# Patient Record
Sex: Female | Born: 1960 | Race: Black or African American | Hispanic: No | Smoking: Never smoker
Health system: Southern US, Community
[De-identification: ages and names within clinical notes are randomized; demographics above are authoritative.]

## PROBLEM LIST (undated history)

## (undated) DIAGNOSIS — F329 Major depressive disorder, single episode, unspecified: Secondary | ICD-10-CM

## (undated) DIAGNOSIS — E079 Disorder of thyroid, unspecified: Secondary | ICD-10-CM

## (undated) DIAGNOSIS — E785 Hyperlipidemia, unspecified: Secondary | ICD-10-CM

## (undated) DIAGNOSIS — R519 Headache, unspecified: Secondary | ICD-10-CM

## (undated) DIAGNOSIS — I10 Essential (primary) hypertension: Secondary | ICD-10-CM

## (undated) DIAGNOSIS — E119 Type 2 diabetes mellitus without complications: Secondary | ICD-10-CM

## (undated) DIAGNOSIS — G40909 Epilepsy, unspecified, not intractable, without status epilepticus: Secondary | ICD-10-CM

## (undated) DIAGNOSIS — F32A Depression, unspecified: Secondary | ICD-10-CM

## (undated) DIAGNOSIS — R51 Headache: Secondary | ICD-10-CM

## (undated) DIAGNOSIS — D649 Anemia, unspecified: Secondary | ICD-10-CM

## (undated) DIAGNOSIS — K760 Fatty (change of) liver, not elsewhere classified: Secondary | ICD-10-CM

## (undated) HISTORY — DX: Epilepsy, unspecified, not intractable, without status epilepticus: G40.909

## (undated) HISTORY — DX: Headache: R51

## (undated) HISTORY — DX: Headache, unspecified: R51.9

## (undated) HISTORY — DX: Major depressive disorder, single episode, unspecified: F32.9

## (undated) HISTORY — DX: Hyperlipidemia, unspecified: E78.5

## (undated) HISTORY — PX: TUBAL LIGATION: SHX77

## (undated) HISTORY — DX: Depression, unspecified: F32.A

## (undated) HISTORY — DX: Anemia, unspecified: D64.9

## (undated) HISTORY — PX: TONSILLECTOMY: SUR1361

## (undated) HISTORY — PX: VEIN LIGATION AND STRIPPING: SHX2653

## (undated) HISTORY — DX: Disorder of thyroid, unspecified: E07.9

## (undated) HISTORY — DX: Type 2 diabetes mellitus without complications: E11.9

---

## 2004-01-10 ENCOUNTER — Emergency Department: Payer: Self-pay | Admitting: Emergency Medicine

## 2004-01-11 ENCOUNTER — Emergency Department: Payer: Self-pay | Admitting: Emergency Medicine

## 2004-01-12 ENCOUNTER — Emergency Department: Payer: Self-pay | Admitting: Emergency Medicine

## 2004-01-13 ENCOUNTER — Emergency Department: Payer: Self-pay | Admitting: Emergency Medicine

## 2004-01-14 ENCOUNTER — Emergency Department: Payer: Self-pay | Admitting: Emergency Medicine

## 2004-01-17 ENCOUNTER — Emergency Department: Payer: Self-pay | Admitting: Emergency Medicine

## 2005-01-09 ENCOUNTER — Other Ambulatory Visit: Payer: Self-pay

## 2005-01-09 ENCOUNTER — Emergency Department: Payer: Self-pay | Admitting: Unknown Physician Specialty

## 2005-06-29 ENCOUNTER — Emergency Department: Payer: Self-pay | Admitting: Emergency Medicine

## 2005-06-29 ENCOUNTER — Other Ambulatory Visit: Payer: Self-pay

## 2006-08-10 ENCOUNTER — Emergency Department: Payer: Self-pay | Admitting: Emergency Medicine

## 2006-09-01 ENCOUNTER — Ambulatory Visit: Payer: Self-pay | Admitting: Emergency Medicine

## 2007-06-02 ENCOUNTER — Other Ambulatory Visit: Payer: Self-pay

## 2007-06-02 ENCOUNTER — Emergency Department: Payer: Self-pay | Admitting: Emergency Medicine

## 2008-01-24 ENCOUNTER — Emergency Department (HOSPITAL_COMMUNITY): Admission: EM | Admit: 2008-01-24 | Discharge: 2008-01-24 | Payer: Self-pay | Admitting: Emergency Medicine

## 2008-12-06 ENCOUNTER — Emergency Department (HOSPITAL_COMMUNITY): Admission: EM | Admit: 2008-12-06 | Discharge: 2008-12-07 | Payer: Self-pay | Admitting: Emergency Medicine

## 2009-03-27 ENCOUNTER — Emergency Department (HOSPITAL_BASED_OUTPATIENT_CLINIC_OR_DEPARTMENT_OTHER): Admission: EM | Admit: 2009-03-27 | Discharge: 2009-03-27 | Payer: Self-pay | Admitting: Emergency Medicine

## 2009-04-19 ENCOUNTER — Ambulatory Visit (HOSPITAL_COMMUNITY): Admission: RE | Admit: 2009-04-19 | Discharge: 2009-04-19 | Payer: Self-pay | Admitting: Otolaryngology

## 2009-04-19 ENCOUNTER — Encounter (INDEPENDENT_AMBULATORY_CARE_PROVIDER_SITE_OTHER): Payer: Self-pay | Admitting: Otolaryngology

## 2009-09-08 ENCOUNTER — Emergency Department (HOSPITAL_COMMUNITY): Admission: EM | Admit: 2009-09-08 | Discharge: 2009-09-08 | Payer: Self-pay | Admitting: Emergency Medicine

## 2010-04-14 LAB — RAPID STREP SCREEN (MED CTR MEBANE ONLY): Streptococcus, Group A Screen (Direct): NEGATIVE

## 2010-04-24 LAB — BASIC METABOLIC PANEL
Calcium: 9.7 mg/dL (ref 8.4–10.5)
Creatinine, Ser: 0.96 mg/dL (ref 0.4–1.2)
GFR calc Af Amer: >60 mL/min (ref >60)
GFR calc non Af Amer: >60 mL/min (ref >60)

## 2010-04-24 LAB — CBC
MCHC: 33.4 g/dL (ref 30.0–36.0)
RBC: 4.67 MIL/uL (ref 3.87–5.11)
WBC: 4.9 10*3/uL (ref 4.0–10.5)

## 2010-04-24 LAB — MRSA PCR SCREENING: MRSA by PCR: NEGATIVE

## 2010-05-03 LAB — DIFFERENTIAL
Lymphocytes Relative: 24 % (ref 12–46)
Lymphs Abs: 1.4 10*3/uL (ref 0.7–4.0)
Neutro Abs: 3.7 10*3/uL (ref 1.7–7.7)
Neutrophils Relative %: 64 % (ref 43–77)

## 2010-05-03 LAB — CBC
Platelets: 244 10*3/uL (ref 150–400)
WBC: 5.8 10*3/uL (ref 4.0–10.5)

## 2010-05-03 LAB — URINALYSIS, ROUTINE W REFLEX MICROSCOPIC
Glucose, UA: NEGATIVE mg/dL
Ketones, ur: NEGATIVE mg/dL
Leukocytes, UA: NEGATIVE
Nitrite: NEGATIVE
Specific Gravity, Urine: 1.018 (ref 1.005–1.030)
pH: 5.5 (ref 5.0–8.0)

## 2010-05-03 LAB — BASIC METABOLIC PANEL
BUN: 17 mg/dL (ref 6–23)
CO2: 25 mEq/L (ref 19–32)
Calcium: 9.1 mg/dL (ref 8.4–10.5)
Creatinine, Ser: 0.92 mg/dL (ref 0.4–1.2)
GFR calc non Af Amer: >60 mL/min (ref >60)
Glucose, Bld: 96 mg/dL (ref 70–99)
Potassium: 3.8 mEq/L (ref 3.5–5.1)

## 2010-05-03 LAB — URINE MICROSCOPIC-ADD ON

## 2010-05-03 LAB — RAPID URINE DRUG SCREEN, HOSP PERFORMED
Amphetamines: NOT DETECTED
Opiates: NOT DETECTED
Tetrahydrocannabinol: NOT DETECTED

## 2010-11-03 LAB — RAPID URINE DRUG SCREEN, HOSP PERFORMED
Barbiturates: NOT DETECTED
Opiates: NOT DETECTED

## 2010-11-03 LAB — GLUCOSE, CAPILLARY: Glucose-Capillary: 101 mg/dL — ABNORMAL HIGH (ref 70–99)

## 2010-11-03 LAB — BASIC METABOLIC PANEL
CO2: 28 mEq/L (ref 19–32)
GFR calc non Af Amer: >60 mL/min (ref >60)
Glucose, Bld: 99 mg/dL (ref 70–99)
Potassium: 4.3 mEq/L (ref 3.5–5.1)
Sodium: 139 mEq/L (ref 135–145)

## 2010-11-03 LAB — DIFFERENTIAL
Basophils Absolute: 0 10*3/uL (ref 0.0–0.1)
Eosinophils Relative: 3 % (ref 0–5)
Lymphocytes Relative: 29 % (ref 12–46)
Monocytes Absolute: 0.5 10*3/uL (ref 0.1–1.0)
Monocytes Relative: 10 % (ref 3–12)

## 2010-11-03 LAB — URINALYSIS, ROUTINE W REFLEX MICROSCOPIC
Bilirubin Urine: NEGATIVE
Hgb urine dipstick: NEGATIVE
Nitrite: NEGATIVE
Specific Gravity, Urine: 1.023 (ref 1.005–1.030)
pH: 5 (ref 5.0–8.0)

## 2010-11-03 LAB — CBC
HCT: 42.9 % (ref 36.0–46.0)
Hemoglobin: 14.3 g/dL (ref 12.0–15.0)
RBC: 4.61 MIL/uL (ref 3.87–5.11)
RDW: 14.4 % (ref 11.5–15.5)

## 2011-04-30 ENCOUNTER — Other Ambulatory Visit (HOSPITAL_COMMUNITY)
Admission: RE | Admit: 2011-04-30 | Discharge: 2011-04-30 | Disposition: A | Discharge status: Home / Self Care | Payer: Medicare Other | Source: Ambulatory Visit | Attending: Family Medicine | Admitting: Family Medicine

## 2011-04-30 ENCOUNTER — Other Ambulatory Visit: Payer: Self-pay | Admitting: Family Medicine

## 2011-04-30 DIAGNOSIS — Z124 Encounter for screening for malignant neoplasm of cervix: Secondary | ICD-10-CM | POA: Insufficient documentation

## 2011-04-30 DIAGNOSIS — Z1159 Encounter for screening for other viral diseases: Secondary | ICD-10-CM | POA: Insufficient documentation

## 2012-09-20 ENCOUNTER — Emergency Department (HOSPITAL_COMMUNITY)
Admission: EM | Admit: 2012-09-20 | Discharge: 2012-09-20 | Disposition: A | Discharge status: Home / Self Care | Payer: Medicare Other | Attending: Emergency Medicine | Admitting: Emergency Medicine

## 2012-09-20 ENCOUNTER — Encounter (HOSPITAL_COMMUNITY): Payer: Self-pay | Admitting: Emergency Medicine

## 2012-09-20 DIAGNOSIS — M79602 Pain in left arm: Secondary | ICD-10-CM

## 2012-09-20 DIAGNOSIS — Z79899 Other long term (current) drug therapy: Secondary | ICD-10-CM | POA: Insufficient documentation

## 2012-09-20 DIAGNOSIS — S4980XA Other specified injuries of shoulder and upper arm, unspecified arm, initial encounter: Secondary | ICD-10-CM | POA: Insufficient documentation

## 2012-09-20 DIAGNOSIS — S46909A Unspecified injury of unspecified muscle, fascia and tendon at shoulder and upper arm level, unspecified arm, initial encounter: Secondary | ICD-10-CM | POA: Insufficient documentation

## 2012-09-20 DIAGNOSIS — I10 Essential (primary) hypertension: Secondary | ICD-10-CM | POA: Insufficient documentation

## 2012-09-20 DIAGNOSIS — Z880 Allergy status to penicillin: Secondary | ICD-10-CM | POA: Insufficient documentation

## 2012-09-20 DIAGNOSIS — Y929 Unspecified place or not applicable: Secondary | ICD-10-CM | POA: Insufficient documentation

## 2012-09-20 DIAGNOSIS — Y9389 Activity, other specified: Secondary | ICD-10-CM | POA: Insufficient documentation

## 2012-09-20 DIAGNOSIS — X500XXA Overexertion from strenuous movement or load, initial encounter: Secondary | ICD-10-CM | POA: Insufficient documentation

## 2012-09-20 HISTORY — DX: Essential (primary) hypertension: I10

## 2012-09-20 MED ORDER — NAPROXEN 500 MG PO TABS: 500.0000 mg | ORAL_TABLET | Freq: Two times a day (BID) | ORAL | Status: DC

## 2012-09-20 NOTE — ED Provider Notes (Signed)
CSN: [id number]     Arrival date & time 09/20/12  1205 History     First MD Initiated Contact with Patient 09/20/12 1219     Chief Complaint  Patient presents with  . Arm Pain    l/arm pain nx 2 weeks   (Consider location/radiation/quality/duration/timing/severity/associated sxs/prior Treatment) HPI Comments: Patient presents today with a chief complaint of left arm pain.  She reports that the pain is primarily in the left bicep.  Pain is worse with extension of the left elbow.  She reports that the pain has been present for the past two weeks.  She states that she was lifting her grandson two weeks ago.  He began trying to get down and she felt a pull in her arm.  She states that initially she had some swelling of her arm, but the swelling has since resolved.  She has been taking Naproxen for the pain, which is helping.  She denies any numbness or tingling.  Denies any bruising or erythema.    The history is provided by the patient.    Past Medical History  Diagnosis Date  . Hypertension    Past Surgical History  Procedure Laterality Date  . Tubal ligation    . Tonsillectomy    . Cesarean section     Family History  Problem Relation Age of Onset  . Hypertension Mother   . Stroke Mother    History  Substance Use Topics  . Smoking status: Never Smoker   . Smokeless tobacco: Not on file  . Alcohol Use: No   OB History   Grav Para Term Preterm Abortions TAB SAB Ect Mult Living                 Review of Systems  Musculoskeletal:       Left arm pain    Allergies  Penicillins  Home Medications   Current Outpatient Rx  Name  Route  Sig  Dispense  Refill  . atorvastatin (LIPITOR) 20 MG tablet   Oral   Take 20 mg by mouth daily.         Marland Kitchen lamoTRIgine (LAMICTAL) 25 MG tablet   Oral   Take 75 mg by mouth 2 (two) times daily.         Marland Kitchen lisinopril-hydrochlorothiazide (PRINZIDE,ZESTORETIC) 10-12.5 MG per tablet   Oral   Take 1 tablet by mouth daily.          . Multiple Vitamin (MULTIVITAMIN WITH MINERALS) TABS tablet   Oral   Take 1 tablet by mouth daily.         . naproxen sodium (ANAPROX) 220 MG tablet   Oral   Take 220 mg by mouth 2 (two) times daily with a meal.         . sertraline (ZOLOFT) 25 MG tablet   Oral   Take 25 mg by mouth daily.          BP 147/90  Pulse 65  Temp(Src) 98.1 F (36.7 C) (Oral)  Resp 18  SpO2 99%  LMP 09/14/2012 Physical Exam  Nursing note and vitals reviewed. Constitutional: She appears well-developed and well-nourished.  HENT:  Head: Normocephalic and atraumatic.  Cardiovascular: Normal rate, regular rhythm and normal heart sounds.   Pulses:      Radial pulses are 2+ on the right side, and 2+ on the left side.  Pulmonary/Chest: Effort normal and breath sounds normal.  Musculoskeletal:       Left shoulder: She exhibits normal range  of motion, no tenderness, no swelling and no deformity.       Left elbow: She exhibits no swelling, no effusion and no deformity. No tenderness found.  Pain with full extension of the left elbow.  Full passive ROM of the left elbow, but extension of the elbow limited with AROM.  Neurological: She is alert. No sensory deficit.  Skin: Skin is warm and dry. No erythema.  Psychiatric: She has a normal mood and affect.    ED Course   Procedures (including critical care time)  Labs Reviewed - No data to display No results found. No diagnosis found.  MDM  Patient presenting with pain of the left bicep.  Suspect muscle strain.  RICE instructions given to the patient.  Patient instructed to continue Naproxen.  Patient is neurovascularly intact.  Patient is stable for discharge.  Given referral to Orthopedics if pain is not improving.  Pascal Lux Yarnell, PA-C 09/20/12 1320

## 2012-09-20 NOTE — ED Notes (Signed)
Pt reports pain in l/arm increasing over 2 weeks. Pt had been lifting grandchild, tx with naproxen

## 2012-09-20 NOTE — ED Provider Notes (Signed)
Medical screening examination/treatment/procedure(s) were performed by non-physician practitioner and as supervising physician I was immediately available for consultation/collaboration.   Joya Gaskins, MD 09/20/12 [phone]

## 2012-11-07 ENCOUNTER — Other Ambulatory Visit: Payer: Self-pay | Admitting: Neurology

## 2012-11-18 ENCOUNTER — Encounter: Payer: Self-pay | Admitting: Nurse Practitioner

## 2012-11-18 ENCOUNTER — Ambulatory Visit (INDEPENDENT_AMBULATORY_CARE_PROVIDER_SITE_OTHER): Payer: Medicare Other | Admitting: Nurse Practitioner

## 2012-11-18 ENCOUNTER — Telehealth: Payer: Self-pay | Admitting: Neurology

## 2012-11-18 VITALS — BP 114/78 | HR 77 | Ht 62.5 in | Wt 175.0 lb

## 2012-11-18 DIAGNOSIS — G40909 Epilepsy, unspecified, not intractable, without status epilepticus: Secondary | ICD-10-CM

## 2012-11-18 DIAGNOSIS — G43009 Migraine without aura, not intractable, without status migrainosus: Secondary | ICD-10-CM

## 2012-11-18 DIAGNOSIS — I10 Essential (primary) hypertension: Secondary | ICD-10-CM | POA: Insufficient documentation

## 2012-11-18 DIAGNOSIS — G40309 Generalized idiopathic epilepsy and epileptic syndromes, not intractable, without status epilepticus: Secondary | ICD-10-CM

## 2012-11-18 DIAGNOSIS — R404 Transient alteration of awareness: Secondary | ICD-10-CM

## 2012-11-18 DIAGNOSIS — G40209 Localization-related (focal) (partial) symptomatic epilepsy and epileptic syndromes with complex partial seizures, not intractable, without status epilepticus: Secondary | ICD-10-CM | POA: Insufficient documentation

## 2012-11-18 DIAGNOSIS — G4733 Obstructive sleep apnea (adult) (pediatric): Secondary | ICD-10-CM

## 2012-11-18 DIAGNOSIS — R400 Somnolence: Secondary | ICD-10-CM | POA: Insufficient documentation

## 2012-11-18 MED ORDER — LAMOTRIGINE 25 MG PO CHEW: CHEWABLE_TABLET | ORAL | Status: DC

## 2012-11-18 NOTE — Telephone Encounter (Signed)
After reviewing the sleep study referral, I entered a split night sleep study request on this patient, thanks.  Kaipo Ardis, MD, PhD [facility] ([facility])    

## 2012-11-18 NOTE — Telephone Encounter (Signed)
.. °  Darrol Angel, NP is referring Erica Meyers, 52 y.o. female, for an evaluation of sleep apnea.  Wt: 175 lbs. Ht: 62.5 in. BMI: 31.48  Diagnoses: Excessive Daytime Sleepiness Seizures-Epilepsy Migraine Hypertension Snoring Witnessed Apneas Hyperlipidemia  Medication List: Current Outpatient Prescriptions  Medication Sig Dispense Refill   atorvastatin (LIPITOR) 20 MG tablet Take 20 mg by mouth daily.       lamoTRIgine (LAMICTAL) 25 MG CHEW chewable tablet 3 tabs twice daily  180 tablet  0   lisinopril-hydrochlorothiazide (PRINZIDE,ZESTORETIC) 10-12.5 MG per tablet Take 1 tablet by mouth daily.       Multiple Vitamin (MULTIVITAMIN WITH MINERALS) TABS tablet Take 1 tablet by mouth daily.       naproxen (NAPROSYN) 500 MG tablet Take 1 tablet (500 mg total) by mouth 2 (two) times daily.  30 tablet  0   naproxen sodium (ANAPROX) 220 MG tablet Take 220 mg by mouth 2 (two) times daily with a meal.       sertraline (ZOLOFT) 25 MG tablet Take 25 mg by mouth daily.       No current facility-administered medications for this visit.    This patient presents to Darrol Angel, NP for follow up of seizure disorder.  During this visit she relays that she has a lot of daytime drowsiness and wakes with headaches.  She endorses Epworth at 15.  She has a lifelong history of seizures, she also has migraine headaches, hypertension, and hyperlipidemia.  Upon questioning the patient she says that her fiance tells her that she snores and has periods of not breathing.  He has to shake her.  She wakes choking and gasping.  Darrol Angel, NP would like an attended sleep study for this patient to rule out osa.  Insurance:  MEDICAID - Coverage is 100% - Prior approval is not required

## 2012-11-18 NOTE — Patient Instructions (Signed)
Will continue Lamictal at current dose.  Will refill for 1 year Will set up for sleep study
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
## 2012-12-10 ENCOUNTER — Ambulatory Visit (INDEPENDENT_AMBULATORY_CARE_PROVIDER_SITE_OTHER): Payer: Medicare Other

## 2012-12-10 DIAGNOSIS — G479 Sleep disorder, unspecified: Secondary | ICD-10-CM

## 2012-12-10 DIAGNOSIS — G40909 Epilepsy, unspecified, not intractable, without status epilepticus: Secondary | ICD-10-CM

## 2012-12-10 DIAGNOSIS — R400 Somnolence: Secondary | ICD-10-CM

## 2012-12-10 DIAGNOSIS — G4733 Obstructive sleep apnea (adult) (pediatric): Secondary | ICD-10-CM

## 2012-12-24 ENCOUNTER — Encounter: Payer: Self-pay | Admitting: *Deleted

## 2012-12-24 ENCOUNTER — Telehealth: Payer: Self-pay | Admitting: Neurology

## 2012-12-24 NOTE — Telephone Encounter (Signed)
I called and spoke with the patient about her sleep study results. 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 I also stated to the patient that Dr. Frances Furbish recommends sleeping on her sides and weight loss. Patient understood and a copy of the report will be mailed to her and faxed to Darrol Angel, NP and her PCP.

## 2012-12-24 NOTE — Telephone Encounter (Signed)
Please call and notify the patient that the recent sleep study did not show any significant obstructive sleep apnea, except for mild OSA when she sleeps on her back and when in dream sleep. She is advised to lose weight and to try to sleep on her sides. Please inform patient that Darrol Angel, NP will go over the details of the study during her routine follow up appointment. Also, route or fax report to PCP and referring MD, if other than PCP.  Once you have spoken to patient, you can close this encounter.   Thanks,  Huston Foley, MD, PhD [facility])

## 2013-03-11 ENCOUNTER — Other Ambulatory Visit: Payer: Self-pay | Admitting: Neurology

## 2013-03-17 DIAGNOSIS — Z0289 Encounter for other administrative examinations: Secondary | ICD-10-CM

## 2013-09-28 DIAGNOSIS — Z0289 Encounter for other administrative examinations: Secondary | ICD-10-CM

## 2013-10-27 ENCOUNTER — Other Ambulatory Visit: Payer: Self-pay | Admitting: Neurology

## 2013-12-03 ENCOUNTER — Other Ambulatory Visit: Payer: Self-pay | Admitting: Neurology

## 2013-12-15 ENCOUNTER — Telehealth: Payer: Self-pay | Admitting: Nurse Practitioner

## 2013-12-15 ENCOUNTER — Other Ambulatory Visit: Payer: Self-pay | Admitting: Neurology

## 2013-12-15 NOTE — Telephone Encounter (Signed)
Patient stated Rx lamoTRIgine (LAMICTAL) 25 MG CHEW chewable tablet isn't helping with headaches and blurry vision is getting worse.  Stated if she doesn't take Aleve along with medication, headache turns into a Migraine.  Please call and advise.

## 2013-12-15 NOTE — Telephone Encounter (Signed)
I called the patient and told her that we would need to make an appointment for her to see either Hoyle Sauer or [name] for follow up on her HA's since she has not been seen in a year.  We may even be able to schedule with Dr. Jaynee Eagles if she approves.  Patient had to check her schedule and will call back tomorrow to schedule.

## 2013-12-26 ENCOUNTER — Other Ambulatory Visit: Payer: Self-pay | Admitting: Neurology

## 2014-01-04 ENCOUNTER — Encounter: Payer: Self-pay | Admitting: Neurology

## 2014-01-04 ENCOUNTER — Ambulatory Visit (INDEPENDENT_AMBULATORY_CARE_PROVIDER_SITE_OTHER): Payer: Medicare Other | Admitting: Neurology

## 2014-01-04 VITALS — BP 129/80 | HR 97 | Ht 63.0 in | Wt 185.4 lb

## 2014-01-04 DIAGNOSIS — R51 Headache: Secondary | ICD-10-CM

## 2014-01-04 DIAGNOSIS — G43709 Chronic migraine without aura, not intractable, without status migrainosus: Secondary | ICD-10-CM

## 2014-01-04 DIAGNOSIS — H9190 Unspecified hearing loss, unspecified ear: Secondary | ICD-10-CM

## 2014-01-04 DIAGNOSIS — G4700 Insomnia, unspecified: Secondary | ICD-10-CM | POA: Diagnosis not present

## 2014-01-04 DIAGNOSIS — H539 Unspecified visual disturbance: Secondary | ICD-10-CM

## 2014-01-04 DIAGNOSIS — H905 Unspecified sensorineural hearing loss: Secondary | ICD-10-CM | POA: Diagnosis not present

## 2014-01-04 DIAGNOSIS — R519 Headache, unspecified: Secondary | ICD-10-CM

## 2014-01-04 DIAGNOSIS — R569 Unspecified convulsions: Secondary | ICD-10-CM | POA: Diagnosis not present

## 2014-01-04 MED ORDER — TOPIRAMATE 25 MG PO TABS: 50.0000 mg | ORAL_TABLET | Freq: Two times a day (BID) | ORAL | Status: DC

## 2014-01-04 MED ORDER — TOPIRAMATE 25 MG PO TABS: 25.0000 mg | ORAL_TABLET | Freq: Two times a day (BID) | ORAL | Status: DC

## 2014-01-04 NOTE — Patient Instructions (Addendum)
Overall you are doing fairly well but I do want to suggest a few things today:   Remember to drink plenty of fluid, eat healthy meals and do not skip any meals. Try to eat protein with a every meal and eat a healthy snack such as fruit or nuts in between meals. Try to keep a regular sleep-wake schedule and try to exercise daily, particularly in the form of walking, 20-30 minutes a day, if you can.   As far as your medications are concerned, I would like to suggest: Week one: 25mg  at night (one tab) Week two: 50mg  at night (2 tabs) Week three: 25mg  in morning(one tab) and 50mg  at night(2 tabs) Week four: 50mg  in the morning and 50mg  at night  As far as diagnostic testing: MRI of the brain, will discuss with Dr. Hassell Done  I would like to see you back for an appointment with [name] in 3 months, sooner if we need to. Please call us with any interim questions, concerns, problems, updates or refill requests.   Please also call us for any test results so we can go over those with you on the phone.  My clinical assistant and will answer any of your questions and relay your messages to me and also relay most of my messages to you.   Our phone number is [phone]. We also have an after hours call service for urgent matters and there is a physician on-call for urgent questions. For any emergencies you know to call 911 or go to the nearest emergency room  To prevent or relieve headaches, try the following: Cool Compress. Lie down and place a cool compress on your head.  Avoid headache triggers. If certain foods or odors seem to have triggered your migraines in the past, avoid them. A headache diary might help you identify triggers.  Include physical activity in your daily routine. Try a daily walk or other moderate aerobic exercise.  Manage stress. Find healthy ways to cope with the stressors, such as delegating tasks on your to-do list.  Practice relaxation techniques. Try deep breathing,  yoga, massage and visualization.  Eat regularly. Eating regularly scheduled meals and maintaining a healthy diet might help prevent headaches. Also, drink plenty of fluids.  Follow a regular sleep schedule. Sleep deprivation might contribute to headaches Consider biofeedback. With this mind-body technique, you learn to control certain bodily functions - such as muscle tension, heart rate and blood pressure - to prevent headaches or reduce headache pain.    Proceed to emergency room if you experience new or worsening symptoms or symptoms do not resolve, if you have new neurologic symptoms or if headache is severe, or for any concerning symptom.

## 2014-01-04 NOTE — Progress Notes (Addendum)
[facility]    Provider:  Dr Jaynee Eagles Referring Provider: Lujean Amel, MD Primary Care Physician:  Lujean Amel, MD  CC:  Headaches and migraines  HPI:  Erica Meyers is a 53 y.o. female here as a referral from Dr. Dorthy Cooler for Headaches and Migraines. She is a patient of [name] and [name] and was offered an appointment with me for acute issues that she needed to discuss urgently since Dr Krista Blue and Hoyle Sauer were booked for several days.    PMHx of HTN, HLD, depression and anxiety. She is a patient of [name] and [name] and she is seeing me for acute headache symptoms. Has had headaches for many years, as long as she can remember. She is taking excedrin migraine and alleve twice a day, she takes daily OTC medications. Feels like someone is crushing her head, and pain in the neck. These are her "regular headaches" but worsening, more severe. Headaches are worsening, more frequent and longer duration with worsening vision changes over last several months with the headaches. She has these daily, all day long until she takes something. She gets loud ringing in her ears. She also has migraines, she describes it similar to the "regular headaches" just worse, with nausea, vomiting, light sensitivity, sound sensitivity. She gets migraines 5x a month. Has to go into a dark, quiet room.  She has high blood pressure. No aura. Unknown triggers. She is not sleeping well. She has insomnia, up until 4-5am. She has high blood pressure.   Can't tolerate any more of the Lamictal. She is following with [name] for seizures. Seizures started as a child.  Reviewed notes, labs and imaging from outside physicians, which showed: She last saw [name] 10/2012  Who manages her AEDs. She recommended sleep study but doesn't appear that it was completed at [facility] because she had a previous sleep study that did not show any significant obstructive sleep apnea, was advised to lose weight and  sleep on her side.   Review of Systems: Patient complains of symptomthat s per HPI as well as the following symptoms:No SOB, No CP . Pertinent negatives per HPI. All others negative.   History   Social History  . Marital Status: Single    Spouse Name: N/A    Number of Children: N/A  . Years of Education: N/A   Occupational History  . Not on file.   Social History Main Topics  . Smoking status: Never Smoker   . Smokeless tobacco: Never Used  . Alcohol Use: No  . Drug Use: No  . Sexual Activity: Not on file   Other Topics Concern  . Not on file   Social History Narrative  . No narrative on file    Family History  Problem Relation Age of Onset  . Hypertension Mother   . Stroke Mother     Past Medical History  Diagnosis Date  . Hypertension     Past Surgical History  Procedure Laterality Date  . Tubal ligation    . Tonsillectomy    . Cesarean section      Current Outpatient Prescriptions  Medication Sig Dispense Refill  . atorvastatin (LIPITOR) 20 MG tablet Take 20 mg by mouth daily.    Marland Kitchen lamoTRIgine (LAMICTAL) 25 MG CHEW chewable tablet Chew 3 tablets (75 mg total) by mouth 2 (two) times daily. 180 tablet 0  . lisinopril-hydrochlorothiazide (PRINZIDE,ZESTORETIC) 10-12.5 MG per tablet Take 1 tablet by mouth daily.    . Multiple Vitamin (  MULTIVITAMIN WITH MINERALS) TABS tablet Take 1 tablet by mouth daily.    . naproxen sodium (ANAPROX) 220 MG tablet Take 220 mg by mouth 2 (two) times daily with a meal.    . sertraline (ZOLOFT) 25 MG tablet Take 25 mg by mouth daily.     No current facility-administered medications for this visit.    Allergies as of 01/04/2014 - Review Complete 01/04/2014  Allergen Reaction Noted  . Penicillins  09/20/2012    Vitals: BP 129/80 mmHg  Pulse 97  Ht 5\' 3"  (1.6 m)  Wt 185 lb 6.4 oz (84.097 kg)  BMI 32.85 kg/m2 Last Weight:  Wt Readings from Last 1 Encounters:  01/04/14 185 lb 6.4 oz (84.097 kg)   Last Height:   Ht  Readings from Last 1 Encounters:  01/04/14 5\' 3"  (1.6 m)   Physical exam: Exam: Gen: NAD, conversant, well nourised, well groomed                     CV: RRR, no MRG. No Carotid Bruits. No peripheral edema, warm, nontender Eyes: Conjunctivae clear without exudates or hemorrhage  Neuro: Detailed Neurologic Exam  Speech:    Speech is normal; fluent and spontaneous with normal comprehension.  Cognition:    The patient is oriented to person, place, and time;     recent and remote memory intact;     language fluent;     normal attention, concentration,     fund of knowledge Cranial Nerves:    The pupils are equal, round, and reactive to light. The fundi are normal and spontaneous venous pulsations are present. Visual fields are full to finger confrontation. Extraocular movements are intact. Trigeminal sensation is intact and the muscles of mastication are normal. The face is symmetric. The palate elevates in the midline. Voice is normal. Shoulder shrug is normal. The tongue has normal motion without fasciculations.   Coordination:    Normal finger to nose and heel to shin.   Gait:    Heel-toe and tandem gait are normal.   Motor Observation:    No asymmetry, no atrophy, and no involuntary movements noted. Tone:    Normal muscle tone.    Posture:    Posture is normal. normal erect    Strength:    Strength is V/V in the upper and lower limbs.      Sensation: intact to LT     Reflex Exam:  DTR's:    Deep tendon reflexes in the upper and lower extremities are normal bilaterally.   Toes:    The toes are downgoing bilaterally.   Clonus:    Clonus is absent.     Assessment/Plan:  53 year old female with a PMHx of migraines, HTN, HLD, depression, anxiety and seizures who is here for chronic migraine management. Neuro exam is non focal.  Medication overuse and rebound: She takes OTC medications multiple times a day. Explained rebound phenomena and instructed to slowly titrate  OTC medications. Do not take more than 2x a day and 2 days a week.   Migraines: Will start Topamax slowly until reached 50mg  bid. Discussed common side effects and teratogenic effects. Follow up in 3 months. Call with any problems or side effects from the medication. No imaging of the brain on file, she doesn't know when/if she ever had it. In light of worsening headaches and seizures, offered MRI of the brain w/wo contrast. Needs renal function lab which they can do with an istat before imagng.  Will inform Dr. Hassell Done. Discussed medication overuse and rebound headaches.  Addendum: patient called back and said that headaches worsening, excruciating, like someone is squeezing her head from both sides every day, continuous. She has blurry vision with the headaches. The headaches last all day long. At night they subside a little. She has hearing changes. Vision worsening.  At this time, added a lumbar puncture with opening pressure in addition to the MRI of the brain  MRI of the brain is ordered. Also think it is a good idea to check opening pressure for IIH. Will order LP. Will increase Topamax to 100mg  qhs and 50mg  qam.    Insomnia: Can try Melatonin 5mg  2 hours before bedtime  Seizures: Continue Lamictal for seizures, advised follow up with Dr. Hassell Done within the next month as she has followed up for seizures in over a year.  Also discussed:  To prevent or relieve headaches, try the following: Cool Compress. Lie down and place a cool compress on your head.  Avoid headache triggers. If certain foods or odors seem to have triggered your migraines in the past, avoid them. A headache diary might help you identify triggers.  Include physical activity in your daily routine. Try a daily walk or other moderate aerobic exercise.  Manage stress. Find healthy ways to cope with the stressors, such as delegating tasks on your to-do list.  Practice relaxation techniques. Try deep breathing, yoga, massage and  visualization.  Eat regularly. Eating regularly scheduled meals and maintaining a healthy diet might help prevent headaches. Also, drink plenty of fluids.  Follow a regular sleep schedule. Sleep deprivation might contribute to headaches Consider biofeedback. With this mind-body technique, you learn to control certain bodily functions - such as muscle tension, heart rate and blood pressure - to prevent headaches or reduce headache pain.    Proceed to emergency room if you experience new or worsening symptoms or symptoms do not resolve, if you have new neurologic symptoms or if headache is severe, or for any concerning symptom.   Sarina Ill, MD  [facility] [address] [place], [place] [address]  Phone [phone] Fax [phone] Lenor Coffin

## 2014-03-08 ENCOUNTER — Other Ambulatory Visit: Payer: Self-pay | Admitting: Neurology

## 2014-04-01 ENCOUNTER — Encounter: Payer: Self-pay | Admitting: Dietician

## 2014-04-01 ENCOUNTER — Encounter: Payer: Medicare Other | Attending: Family Medicine | Admitting: Dietician

## 2014-04-01 VITALS — Wt 181.5 lb

## 2014-04-01 DIAGNOSIS — E669 Obesity, unspecified: Secondary | ICD-10-CM | POA: Diagnosis not present

## 2014-04-01 DIAGNOSIS — R7309 Other abnormal glucose: Secondary | ICD-10-CM | POA: Insufficient documentation

## 2014-04-01 DIAGNOSIS — Z713 Dietary counseling and surveillance: Secondary | ICD-10-CM | POA: Insufficient documentation

## 2014-04-01 DIAGNOSIS — R7303 Prediabetes: Secondary | ICD-10-CM

## 2014-04-01 DIAGNOSIS — Z6832 Body mass index (BMI) 32.0-32.9, adult: Secondary | ICD-10-CM | POA: Insufficient documentation

## 2014-04-01 NOTE — Progress Notes (Signed)
  Medical Nutrition Therapy:  Appt start time: 1115 end time:  1205   Assessment:  Primary concerns today: Jaydence is here today reporting that she gained a lot of weight recently and is also having problems with high blood sugar (HgbA1c 6.2%). She states she has been trying to change her diet and become more active for the past 3 months. Her adult daughter brings her healthy homemade food daily. Carmelina has stopped eating fast food and pizza and has stopped drinking juice. She has also stopped skipping meals. Keshauna has lost 5 pounds in the last month. She has epilepsy and cannot drive.   Preferred Learning Style:   No preference indicated   Learning Readiness:   Ready  Change in progress   MEDICATIONS: see list   DIETARY INTAKE:   24-hr recall:  B ( AM): egg whites and [place] sausage, multigrain toast, and grapefruit with orange juice OR steel cut oats Snk ( AM): granola with Fage Greek yogurt  L ( PM): salad (kale and spinach), nuts/seeds, cheese, chicken or [place], homemade vinaigrette dressing Snk ( PM): nuts and fruit and cheese OR protein shake/smoothie D ( PM): salmon or baked chicken or veggie lasagna or cauliflower crust pizza Snk (8 PM): smoothie or Kashi cereal or popcorn  Beverages: 1 gallon water per day, orange juice with breakfast, green tea with Stevia or agave   Usual physical activity: 15-30 min of weights 2-3x a week and walking  Estimated energy needs: 1400-1600 calories 158-180 g carbohydrates 105-120 g protein 39-44 g fat  Progress Towards Goal(s):  In progress.   Nutritional Diagnosis:  [place]-2.2 Altered nutrition-related laboratory As related to overweight and history of excessive carbohydrate intake.  As evidenced by HgbA1c 6.2%.    Intervention:  Nutrition counseling provided. Discussed carbohydrate metabolism and insulin action. Recommended carbohydrate controlled diet and physical activity to manage blood glucose levels. Encouraged the patient to  continue healthy, balanced eating pattern.  Teaching Method Utilized:  Visual Auditory  Handouts given during visit include:  MyPlate  Meal Planning card  Barriers to learning/adherence to lifestyle change: none  Demonstrated degree of understanding via:  Teach Back   Monitoring/Evaluation:  Dietary intake, exercise, labs, and body weight in 3 month(s).

## 2014-04-05 ENCOUNTER — Other Ambulatory Visit: Payer: Self-pay | Admitting: Neurology

## 2014-04-05 NOTE — Telephone Encounter (Signed)
Pt must call for appt.

## 2014-04-20 ENCOUNTER — Telehealth: Payer: Self-pay | Admitting: Neurology

## 2014-04-20 NOTE — Telephone Encounter (Signed)
Patient stated Rx topiramate (TOPAMAX) 25 MG tablet has not helped with Migraines and questioning status of MRI as discussed at [facility] 01/04/14.  Patient experiencing Migraine and informed her to go to ER as indicated on OV note from 12/7.  Please call and advise.

## 2014-04-21 NOTE — Addendum Note (Signed)
Addended by: Sarina Ill B on: 04/21/2014 08:49 PM   Modules accepted: Orders

## 2014-04-21 NOTE — Telephone Encounter (Signed)
Erica Meyers - Can you clarify what dose of Topamax she is on? I prescribed her Topamax with a titration dose to 50mg  twice daily  (Week one: 25mg  at night (one tab) Week two: 50mg  at night (2 tabs) Week three: 25mg  in morning(one tab) and 50mg  at night(2 tabs) Week four: 50mg  in the morning and 50mg  at night)  She is a actually a patient of Erica Meyers and [name] and her instructions in December were to follow up with [name] in 3 months for further management. Please remind her that she should have an appointment to follow up with [name] and [name] for seizure management and they can also discuss headache at that time. There is no need for patient to see multiple providers in the same practice. See if she has a follow up and ensure it is with [name] please.  Let her know the MRi was ordered and we will follow up with her in the next week to schedule pending insurance approval, I will discuss with [name] and see if we can expedite.  Thank you

## 2014-04-22 ENCOUNTER — Telehealth: Payer: Self-pay | Admitting: *Deleted

## 2014-04-22 NOTE — Telephone Encounter (Signed)
Talked with patient and she said she currently takes the topamax 50mg  in the morning and 50 mg at night. She titrated up to this dose per Dr. Jaynee Eagles instructions. She stated this has not helped her headaches and she has been having them almost everyday. I told her I would speak with Dr. Jaynee Eagles about this and call her back. I also told her Dr. Jaynee Eagles ordered the MRI and they will contact her to schedule that once her insurance is processed.   I also spoke with pt about following up with [name] and [name] as this was who she originally saw per Dr. Jaynee Eagles. Pt verbalized understanding. I set up a follow up appointment for June 17th, 2016 at 8:00am. She said she would call back if this appointment does not work.

## 2014-04-23 ENCOUNTER — Other Ambulatory Visit: Payer: Self-pay | Admitting: Neurology

## 2014-04-23 DIAGNOSIS — G932 Benign intracranial hypertension: Secondary | ICD-10-CM

## 2014-04-23 MED ORDER — TOPIRAMATE 50 MG PO TABS: ORAL_TABLET | ORAL | Status: DC

## 2014-04-23 NOTE — Telephone Encounter (Signed)
Patient has MRI ordered. LP for opening pressure ordered.   Erica Meyers - Patient has an appointment for MRI of the brain, I am not sure where - she said they called today. Order doesn't say where she was referred to?  If she is going to [facility] imaging, can she have the LP done afterwards? How can we get the LP set up? She needs the MRi of the brain before the lumbar puncture to ensure no masses. thanks

## 2014-04-23 NOTE — Telephone Encounter (Signed)
Thank you :)

## 2014-04-23 NOTE — Telephone Encounter (Signed)
Called patient. She stopped taking the daily nsaids. She feels like someone is squeezing her head from both sides every day, continuous. She has blurry vision with the headaches. The headaches last all day long. At night they subside a little. She is obese. She has hearing changes. Vision worsening.   MRI of the brain is ordered. Also think it is a good idea to check opening pressure for IIH. Will order LP. Will increase Topamax to 100mg  qhs and 50mg  qam.

## 2014-04-23 NOTE — Telephone Encounter (Signed)
Pt orders have been sent to [facility] for both the mri and LP

## 2014-05-25 ENCOUNTER — Telehealth: Payer: Self-pay | Admitting: Neurology

## 2014-05-25 NOTE — Telephone Encounter (Signed)
Approval [id number] for MRi brain w/wo contrast thank you

## 2014-05-26 ENCOUNTER — Ambulatory Visit
Admission: RE | Admit: 2014-05-26 | Discharge: 2014-05-26 | Disposition: A | Discharge status: Home / Self Care | Payer: Medicare Other | Source: Ambulatory Visit | Attending: Neurology | Admitting: Neurology

## 2014-05-26 ENCOUNTER — Ambulatory Visit
Admission: RE | Admit: 2014-05-26 | Discharge: 2014-05-26 | Disposition: A | Discharge status: Home / Self Care | Payer: PRIVATE HEALTH INSURANCE | Source: Ambulatory Visit | Attending: Neurology | Admitting: Neurology

## 2014-05-26 ENCOUNTER — Other Ambulatory Visit: Payer: Self-pay | Admitting: Neurology

## 2014-05-26 DIAGNOSIS — H539 Unspecified visual disturbance: Secondary | ICD-10-CM

## 2014-05-26 DIAGNOSIS — R519 Headache, unspecified: Secondary | ICD-10-CM

## 2014-05-26 DIAGNOSIS — R569 Unspecified convulsions: Secondary | ICD-10-CM

## 2014-05-26 DIAGNOSIS — R51 Headache: Secondary | ICD-10-CM | POA: Diagnosis not present

## 2014-05-26 DIAGNOSIS — G932 Benign intracranial hypertension: Secondary | ICD-10-CM

## 2014-05-26 DIAGNOSIS — H9190 Unspecified hearing loss, unspecified ear: Secondary | ICD-10-CM

## 2014-05-26 LAB — CSF CELL COUNT WITH DIFFERENTIAL
RBC Count, CSF: 0 cu mm
Tube #: 2
WBC, CSF: 1 cu mm (ref 0–5)

## 2014-05-26 LAB — GLUCOSE, CSF: Glucose, CSF: 71 mg/dL (ref 43–76)

## 2014-05-26 LAB — PROTEIN, CSF: Total Protein, CSF: 36 mg/dL (ref 15–45)

## 2014-05-26 MED ORDER — GADOBENATE DIMEGLUMINE 529 MG/ML IV SOLN
16.0000 mL | Freq: Once | INTRAVENOUS | Status: AC | PRN
  Administered 2014-05-26: 16 mL via INTRAVENOUS

## 2014-05-26 NOTE — Progress Notes (Signed)
Pt had a vaso-vagal response during LP. After lying in the nursing area for about 30 minutes pt felt well enough to go to bathroom. Again she got light headed and had to lay back down. Pt has not eaten since last night and all she had then was a salad. Pt ate crackers and coke and was feeling better. HOB elevated and pt felt like getting dressed. Pt feeling better at present.

## 2014-05-26 NOTE — Discharge Instructions (Signed)

## 2014-05-27 ENCOUNTER — Telehealth: Payer: Self-pay | Admitting: Neurology

## 2014-05-27 ENCOUNTER — Other Ambulatory Visit: Payer: Self-pay | Admitting: Neurology

## 2014-05-27 NOTE — Telephone Encounter (Signed)
Patient is calling to get MRI results. °

## 2014-05-27 NOTE — Telephone Encounter (Signed)
Patient has appt scheduled

## 2014-05-27 NOTE — Telephone Encounter (Signed)
Patient called and stated that her Rx. lamoTRIgine (LAMICTAL) 25 MG CHEW chewable tablet was out and that the pharmacy told her they did not have the Rx. To refill it. Please call and advise.

## 2014-05-27 NOTE — Telephone Encounter (Signed)
The Pharmacy actually sent a refill request earlier today, which was already approved to last until appt.  I called back.  She is aware.

## 2014-05-27 NOTE — Telephone Encounter (Signed)
Talked with patient to let her know the MRI results were not ready yet and I will call her back once they come back. Pt verbalized understanding.

## 2014-05-27 NOTE — Telephone Encounter (Signed)
Called patient to let her know the opening pressure of her lumbar puncture was normal and that she does not have elevated pressure in her brain or intracerebral hypertension. Pt verbalized understanding.

## 2014-05-31 ENCOUNTER — Telehealth: Payer: Self-pay | Admitting: *Deleted

## 2014-05-31 NOTE — Telephone Encounter (Signed)
Available appt provided to patient.

## 2014-05-31 NOTE — Telephone Encounter (Signed)
Talked with patient to let her know we frequently do not know why patients have headaches and that is why we do further testing such as an MRI of the brain and other tests to rule out serious causes such as lesions or tumors. Given we don't know why people have headaches sometimes, we try and manage the symptoms. She stated her headaches are getting worse and she is still having double vision. She said she is making an appointment with her eye doctor as well. Pt stated when she went to get MRI completed, she had 2 episodes of vasalvagal syncope. She stated her blood pressure also dropped to 90/50's. I told her she needs to follow up in the office, and I will check with Hoyle Sauer to see if she can fit her in sooner than her June follow-up appointment. Pt verbalized understanding.

## 2014-05-31 NOTE — Telephone Encounter (Signed)
Selinda Eon please see if [name] can see her. Thanks

## 2014-06-03 ENCOUNTER — Ambulatory Visit: Payer: Self-pay | Admitting: Neurology

## 2014-06-03 ENCOUNTER — Telehealth: Payer: Self-pay | Admitting: *Deleted

## 2014-06-03 NOTE — Addendum Note (Signed)
Addended by: Sarina Ill B on: 06/03/2014 06:42 PM   Modules accepted: Level of Service

## 2014-06-03 NOTE — Telephone Encounter (Signed)
No showed appt today. 

## 2014-06-04 ENCOUNTER — Encounter: Payer: Self-pay | Admitting: Neurology

## 2014-06-22 ENCOUNTER — Telehealth: Payer: Self-pay | Admitting: *Deleted

## 2014-06-22 NOTE — Telephone Encounter (Signed)
Labs reported on 06/10/14 by Sadie Haber at [facility] [phone]).  HBG A1C: 6.1  CMP: -Glucose: 126 -BUN: 16 -AST: 143 -ALT: 79  Hepatitis Panel: -HBsAG Screen: Negative -Hep B Core Ab, Tot: Negative -Hep B Surface Ab, Qual: Non Reactive

## 2014-07-01 DIAGNOSIS — Z0289 Encounter for other administrative examinations: Secondary | ICD-10-CM

## 2014-07-06 ENCOUNTER — Ambulatory Visit: Payer: PRIVATE HEALTH INSURANCE | Admitting: Dietician

## 2014-07-16 ENCOUNTER — Encounter: Payer: Self-pay | Admitting: Nurse Practitioner

## 2014-07-16 ENCOUNTER — Ambulatory Visit (INDEPENDENT_AMBULATORY_CARE_PROVIDER_SITE_OTHER): Payer: Medicare Other | Admitting: Nurse Practitioner

## 2014-07-16 VITALS — BP 112/77 | HR 83 | Ht 63.0 in | Wt 174.4 lb

## 2014-07-16 DIAGNOSIS — Z5181 Encounter for therapeutic drug level monitoring: Secondary | ICD-10-CM

## 2014-07-16 DIAGNOSIS — G40309 Generalized idiopathic epilepsy and epileptic syndromes, not intractable, without status epilepticus: Secondary | ICD-10-CM

## 2014-07-16 DIAGNOSIS — G43009 Migraine without aura, not intractable, without status migrainosus: Secondary | ICD-10-CM | POA: Diagnosis not present

## 2014-07-16 MED ORDER — TOPIRAMATE 50 MG PO TABS: 100.0000 mg | ORAL_TABLET | Freq: Two times a day (BID) | ORAL | Status: DC

## 2014-07-16 MED ORDER — LAMOTRIGINE 25 MG PO CHEW
75.0000 mg | CHEWABLE_TABLET | Freq: Two times a day (BID) | ORAL | Status: DC
Start: 2014-07-16 — End: 2014-11-15

## 2014-07-16 NOTE — Progress Notes (Addendum)
[facility]  PATIENT: Erica Meyers DOB: [birth year]   REASON FOR VISIT: Follow-up for seizure disorder and headaches HISTORY FROM: Patient    HISTORY OF PRESENT ILLNESS:Erica Meyers is a 54 y.o. female here as a referral from Dr. Dorthy Cooler for Headaches and Migraines. She is a patient of [name] and [name] and was offered an appointment with me for acute issues that she needed to discuss urgently since Dr Krista Blue and Hoyle Sauer were booked for several days.    PMHx of HTN, HLD, depression and anxiety. She is a patient of [name] and [name] and she is seeing me for acute headache symptoms. Has had headaches for many years, as long as she can remember. She is taking excedrin migraine and alleve twice a day, she takes daily OTC medications. Feels like someone is crushing her head, and pain in the neck. These are her "regular headaches" but worsening, more severe. Headaches are worsening, more frequent and longer duration with worsening vision changes over last several months with the headaches. She has these daily, all day long until she takes something. She gets loud ringing in her ears. She also has migraines, she describes it similar to the "regular headaches" just worse, with nausea, vomiting, light sensitivity, sound sensitivity. She gets migraines 5x a month. Has to go into a dark, quiet room. She has high blood pressure. No aura. Unknown triggers. She is not sleeping well. She has insomnia, up until 4-5am. She has high blood pressure. Can't tolerate any more of the Lamictal. She is following with [name] for seizures. Seizures started as a child. Reviewed notes, labs and imaging from outside physicians, which showed: She last saw [name] 10/2012 Who manages her AEDs. She recommended sleep study but doesn't appear that it was completed at [facility] because she had a previous sleep study that did not show any significant obstructive sleep apnea, was advised to lose  weight and sleep on her side.  UPDATE: 07/16/2014 Erica Meyers 54 year old female returns for follow-up. She was last seen in this office by Dr. Jaynee Eagles on an urgent basis as myself and [name] were unavailable. She returns today stating that she has had 2 episodes of seizure activity since last seen. She was placed on Topamax at her last visit along with the Lamictal for both seizure control and migraines. Previously  my last visit with her was 11/18/2012. At that time sleep study was ordered and it only documented mild obstructive sleep apnea and she was asked to lose weight and sleep on her side. She gets no regular exercise her weight has not changed. She does feel Topamax has been beneficial for her headaches. She complains of blurred vision today but her vision checked as 20/30left 20/20 right. She returns for reevaluation   REVIEW OF SYSTEMS: Full 14 system review of systems performed and notable only for those listed, all others are neg:  Constitutional: Fatigue  Cardiovascular: neg Ear/Nose/Throat: Ringing in the ears Skin: neg Eyes: Blurred vision double vision Respiratory: neg Gastroitestinal: Constipation diarrhea  Hematology/Lymphatic: neg  Endocrine: neg Musculoskeletal:neg Allergy/Immunology: neg Neurological: Dizziness headaches seizure Psychiatric: Depression Sleep : Snoring ALLERGIES: Allergies  Allergen Reactions  . Penicillins     childhood    HOME MEDICATIONS: Outpatient Prescriptions Prior to Visit  Medication Sig Dispense Refill  . atorvastatin (LIPITOR) 20 MG tablet Take 20 mg by mouth daily.    Marland Kitchen lamoTRIgine (LAMICTAL) 25 MG CHEW chewable tablet Chew 3 tablets (75 mg total) by  mouth 2 (two) times daily. 180 tablet 1  . Multiple Vitamin (MULTIVITAMIN WITH MINERALS) TABS tablet Take 1 tablet by mouth daily.    . sertraline (ZOLOFT) 25 MG tablet Take 25 mg by mouth daily.    Marland Kitchen topiramate (TOPAMAX) 50 MG tablet 1 tablet in the morning and 2 tablets at night 90  tablet 6  . lisinopril-hydrochlorothiazide (PRINZIDE,ZESTORETIC) 20-25 MG per tablet Take 20 tablets by mouth daily.    Marland Kitchen topiramate (TOPAMAX) 25 MG tablet   3   No facility-administered medications prior to visit.    PAST MEDICAL HISTORY: Past Medical History  Diagnosis Date  . Hypertension   . Epilepsy     PAST SURGICAL HISTORY: Past Surgical History  Procedure Laterality Date  . Tubal ligation    . Tonsillectomy    . Cesarean section      FAMILY HISTORY: Family History  Problem Relation Age of Onset  . Hypertension Mother   . Stroke Mother     SOCIAL HISTORY: History   Social History  . Marital Status: Single    Spouse Name: N/A  . Number of Children: N/A  . Years of Education: N/A   Occupational History  . Not on file.   Social History Main Topics  . Smoking status: Never Smoker   . Smokeless tobacco: Never Used  . Alcohol Use: No  . Drug Use: No  . Sexual Activity: Not on file   Other Topics Concern  . Not on file   Social History Narrative     PHYSICAL EXAM  Filed Vitals:   07/16/14 0810  BP: 112/77  Pulse: 83  Height: 5\' 3"  (1.6 m)  Weight: 174 lb 6.4 oz (79.107 kg)   Body mass index is 30.9 kg/(m^2).  Generalized: Well developed, in no acute distress , well groomed Head: normocephalic and atraumatic,. Oropharynx benign  Neck: Supple, no carotid bruits  Musculoskeletal: No deformity   Neurological examination   Mentation: Alert oriented to time, place, history taking. Attention span and concentration appropriate. Recent and remote memory intact.  Follows all commands speech and language fluent.   Cranial nerve II-XII: Fundoscopic exam reveals sharp disc margins.Pupils were equal round reactive to light extraocular movements were full, visual field were full on confrontational test. Facial sensation and strength were normal. hearing was intact to finger rubbing bilaterally. Uvula tongue midline. head turning and shoulder shrug were  normal and symmetric.Tongue protrusion into cheek strength was normal. Motor: normal bulk and tone, full strength in the BUE, BLE, fine finger movements normal, no pronator drift. No focal weakness Sensory: normal and symmetric to light touch, pinprick, and  Vibration, proprioception  Coordination: finger-nose-finger, heel-to-shin bilaterally, no dysmetria Reflexes: Brachioradialis 2/2, biceps 2/2, triceps 2/2, patellar 2/2, Achilles 2/2, plantar responses were flexor bilaterally. Gait and Station: Rising up from seated position without assistance, normal stance,  moderate stride, good arm swing, smooth turning, able to perform tiptoe, and heel walking without difficulty. Tandem gait is steady  DIAGNOSTIC DATA (LABS, IMAGING, TESTING)   ASSESSMENT AND PLAN  54 y.o. year old female  has a past medical history of Hypertension and migraines, depression and anxiety and seizure disorder here for follow-up. Neuro exam is nonfocal. She has had 2 seizure events since last seen both brief. The patient is a current patient of [name]  who is out of the office today . This note is sent to the work in doctor.     Will obtain Lamictal level Continue Lamictal at current  doses Increase Topamax to 2 tabs twice daily Keep a record of events Follow up in 4 months with Dr. Luan Pulling, [facility], [facility], [facility] [address], [place], [place] [address] [phone]  I reviewed the above note and documentation by the Nurse Practitioner and agree with the history, physical exam, assessment and plan as outlined above. I was immediately available for face-to-face consultation. Star Age, MD, PhD [facility] [address])

## 2014-07-16 NOTE — Patient Instructions (Signed)
Will obtain Lamictal level Continue Lamictal at current doses Increase Topamax to 2 tabs twice daily Keep a record of events Follow up in 4 months with [name]

## 2014-07-20 LAB — LAMOTRIGINE LEVEL: Lamotrigine Lvl: 4.6 ug/mL (ref 2.0–20.0)

## 2014-07-20 NOTE — Progress Notes (Signed)
Quick Note:  LMVM for pt that lab results for lamictal came back at good level. Continue with same dose. To call back if questions. ______

## 2014-11-15 ENCOUNTER — Ambulatory Visit (INDEPENDENT_AMBULATORY_CARE_PROVIDER_SITE_OTHER): Payer: Medicare Other | Admitting: Neurology

## 2014-11-15 ENCOUNTER — Encounter: Payer: Self-pay | Admitting: Neurology

## 2014-11-15 VITALS — BP 126/64 | HR 72 | Resp 16 | Ht 63.0 in | Wt 164.0 lb

## 2014-11-15 DIAGNOSIS — G40909 Epilepsy, unspecified, not intractable, without status epilepticus: Secondary | ICD-10-CM | POA: Diagnosis not present

## 2014-11-15 DIAGNOSIS — G43009 Migraine without aura, not intractable, without status migrainosus: Secondary | ICD-10-CM | POA: Diagnosis not present

## 2014-11-15 MED ORDER — RIZATRIPTAN BENZOATE 10 MG PO TBDP: 10.0000 mg | ORAL_TABLET | ORAL | Status: DC | PRN

## 2014-11-15 MED ORDER — TOPIRAMATE 100 MG PO TABS
100.0000 mg | ORAL_TABLET | Freq: Two times a day (BID) | ORAL | Status: DC
Start: 2014-11-15 — End: 2015-10-31

## 2014-11-15 MED ORDER — LAMOTRIGINE 100 MG PO TABS: 100.0000 mg | ORAL_TABLET | Freq: Two times a day (BID) | ORAL | Status: DC
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
## 2014-12-20 ENCOUNTER — Other Ambulatory Visit: Payer: Medicare Other

## 2015-01-25 ENCOUNTER — Ambulatory Visit: Payer: Medicare Other | Admitting: Neurology

## 2015-01-25 ENCOUNTER — Telehealth: Payer: Self-pay | Admitting: Neurology

## 2015-01-25 NOTE — Telephone Encounter (Signed)
I called the patient. [name] wanted to see the patient to discuss EEG results. I advised that we should schedule EEG and then r/s appointment with [name]. Appointments scheduled.

## 2015-01-25 NOTE — Telephone Encounter (Signed)
Patient is calling about her appointment today and states that she was susposed to come in about her medications and an EEG which was never scheduled.  Just realizing that she was susposed to have the EEG first she called to cancel the appointment.  Should she come on in today anyway.  Please call.

## 2015-02-03 ENCOUNTER — Telehealth: Payer: Self-pay | Admitting: Neurology

## 2015-02-03 ENCOUNTER — Encounter: Payer: Self-pay | Admitting: *Deleted

## 2015-02-03 NOTE — Telephone Encounter (Signed)
Ok per [name] to provide letter.  She will review and signed upon her return on 02/07/15.  Patient is aware to pick up anytime after this date.

## 2015-02-03 NOTE — Telephone Encounter (Signed)
Patient said she needs a letter for school because was not able to finish the last semester because of her health. The letter needs to state what she is being treated for and why she couldn't finish the semester. She gave me some information and I will give it to [place] to read over. She said she might need 2 letters for her to go back to school. The best number to contact is [phone]

## 2015-02-22 ENCOUNTER — Other Ambulatory Visit: Payer: Self-pay

## 2015-02-23 ENCOUNTER — Encounter: Payer: Self-pay | Admitting: Neurology

## 2015-03-07 ENCOUNTER — Ambulatory Visit: Payer: Self-pay | Admitting: Neurology

## 2015-03-07 ENCOUNTER — Telehealth: Payer: Self-pay | Admitting: *Deleted

## 2015-03-07 NOTE — Telephone Encounter (Signed)
No showed follow up appt. 

## 2015-03-08 ENCOUNTER — Encounter: Payer: Self-pay | Admitting: Neurology

## 2015-03-10 ENCOUNTER — Other Ambulatory Visit: Payer: Self-pay | Admitting: Nurse Practitioner

## 2015-06-13 ENCOUNTER — Other Ambulatory Visit: Payer: Self-pay

## 2015-06-13 DIAGNOSIS — Z1231 Encounter for screening mammogram for malignant neoplasm of breast: Secondary | ICD-10-CM

## 2015-06-15 ENCOUNTER — Ambulatory Visit
Admission: RE | Admit: 2015-06-15 | Discharge: 2015-06-15 | Disposition: A | Discharge status: Home / Self Care | Payer: Medicare Other | Source: Ambulatory Visit

## 2015-06-15 DIAGNOSIS — Z1231 Encounter for screening mammogram for malignant neoplasm of breast: Secondary | ICD-10-CM

## 2015-10-04 ENCOUNTER — Ambulatory Visit (INDEPENDENT_AMBULATORY_CARE_PROVIDER_SITE_OTHER): Payer: Medicare Other | Admitting: Neurology

## 2015-10-04 DIAGNOSIS — G43009 Migraine without aura, not intractable, without status migrainosus: Secondary | ICD-10-CM

## 2015-10-04 DIAGNOSIS — G40909 Epilepsy, unspecified, not intractable, without status epilepticus: Secondary | ICD-10-CM

## 2015-10-12 NOTE — Procedures (Signed)
   HISTORY: Complex partial seizure with secondary generalization, currently taking lamotrigine 100 mg twice a day, Topamax 100 mg twice a day, complains of increased confusion episode  TECHNIQUE:  16 channel EEG was performed based on standard 10-16 international system. One channel was dedicated to EKG, which has demonstrates normal sinus rhythm of 78 beats per minutes.  Upon awakening, the posterior background activity was well-developed, in alpha range, 9 Hz, reactive to eye opening and closure, there was frequent muscle artifact.  There was no evidence of epileptiform discharge.  Photic stimulation was performed, which induced a symmetric photic driving.  Hyperventilation was performed, there was no abnormality elicit.  No sleep was achieved.  CONCLUSION: This is a  normal awake EEG.  There is no electrodiagnostic evidence of epileptiform discharge.  Marcial Pacas, M.D. Ph.D.  [facility] [place], [place] [address] Phone: [phone] Fax:      [phone]

## 2015-10-31 ENCOUNTER — Encounter: Payer: Self-pay | Admitting: Neurology

## 2015-10-31 ENCOUNTER — Ambulatory Visit (INDEPENDENT_AMBULATORY_CARE_PROVIDER_SITE_OTHER): Payer: Medicare Other | Admitting: Neurology

## 2015-10-31 VITALS — BP 131/86 | HR 92 | Ht 63.0 in | Wt 176.0 lb

## 2015-10-31 DIAGNOSIS — G43009 Migraine without aura, not intractable, without status migrainosus: Secondary | ICD-10-CM

## 2015-10-31 DIAGNOSIS — G40209 Localization-related (focal) (partial) symptomatic epilepsy and epileptic syndromes with complex partial seizures, not intractable, without status epilepticus: Secondary | ICD-10-CM

## 2015-10-31 DIAGNOSIS — F419 Anxiety disorder, unspecified: Secondary | ICD-10-CM

## 2015-10-31 MED ORDER — RIZATRIPTAN BENZOATE 10 MG PO TBDP: 10.0000 mg | ORAL_TABLET | ORAL | 11 refills | Status: DC | PRN

## 2015-10-31 MED ORDER — LAMOTRIGINE 100 MG PO TABS: ORAL_TABLET | ORAL | 4 refills | Status: DC

## 2015-10-31 MED ORDER — SERTRALINE HCL 50 MG PO TABS: 50.0000 mg | ORAL_TABLET | Freq: Every day | ORAL | 4 refills | Status: DC

## 2015-10-31 MED ORDER — TOPIRAMATE 100 MG PO TABS: 100.0000 mg | ORAL_TABLET | Freq: Two times a day (BID) | ORAL | 4 refills | Status: DC
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
## 2015-11-26 ENCOUNTER — Other Ambulatory Visit: Payer: Self-pay | Admitting: Neurology

## 2016-02-07 ENCOUNTER — Emergency Department (HOSPITAL_COMMUNITY)
Admission: EM | Admit: 2016-02-07 | Discharge: 2016-02-07 | Discharge status: Absent without leave | Payer: Medicare Other

## 2016-02-07 ENCOUNTER — Encounter (HOSPITAL_BASED_OUTPATIENT_CLINIC_OR_DEPARTMENT_OTHER): Payer: Self-pay | Admitting: *Deleted

## 2016-02-07 ENCOUNTER — Emergency Department (HOSPITAL_BASED_OUTPATIENT_CLINIC_OR_DEPARTMENT_OTHER)
Admission: EM | Admit: 2016-02-07 | Discharge: 2016-02-07 | Disposition: A | Discharge status: Home / Self Care | Payer: Medicare Other | Attending: Emergency Medicine | Admitting: Emergency Medicine

## 2016-02-07 ENCOUNTER — Emergency Department (HOSPITAL_BASED_OUTPATIENT_CLINIC_OR_DEPARTMENT_OTHER): Payer: Medicare Other

## 2016-02-07 DIAGNOSIS — Y929 Unspecified place or not applicable: Secondary | ICD-10-CM | POA: Insufficient documentation

## 2016-02-07 DIAGNOSIS — S79921A Unspecified injury of right thigh, initial encounter: Secondary | ICD-10-CM | POA: Diagnosis present

## 2016-02-07 DIAGNOSIS — S76211A Strain of adductor muscle, fascia and tendon of right thigh, initial encounter: Secondary | ICD-10-CM

## 2016-02-07 DIAGNOSIS — W19XXXA Unspecified fall, initial encounter: Secondary | ICD-10-CM | POA: Diagnosis not present

## 2016-02-07 DIAGNOSIS — I10 Essential (primary) hypertension: Secondary | ICD-10-CM | POA: Diagnosis not present

## 2016-02-07 DIAGNOSIS — S76911A Strain of unspecified muscles, fascia and tendons at thigh level, right thigh, initial encounter: Secondary | ICD-10-CM | POA: Diagnosis not present

## 2016-02-07 DIAGNOSIS — Y999 Unspecified external cause status: Secondary | ICD-10-CM | POA: Insufficient documentation

## 2016-02-07 DIAGNOSIS — Y9301 Activity, walking, marching and hiking: Secondary | ICD-10-CM | POA: Insufficient documentation

## 2016-02-07 DIAGNOSIS — Z79899 Other long term (current) drug therapy: Secondary | ICD-10-CM | POA: Diagnosis not present

## 2016-02-07 MED ORDER — DIAZEPAM 5 MG PO TABS: 5.0000 mg | ORAL_TABLET | Freq: Four times a day (QID) | ORAL | 0 refills | Status: DC | PRN

## 2016-02-07 NOTE — ED Notes (Signed)
ED Provider at bedside. 

## 2016-02-07 NOTE — Discharge Instructions (Signed)
Take 4 over the counter ibuprofen tablets 3 times a day or 2 over-the-counter naproxen tablets twice a day for pain. Also take tylenol 1000mg (2 extra strength) four times a day.  Do not take meloxicam if you are taking ibuprofen.

## 2016-02-07 NOTE — ED Triage Notes (Signed)
Pt c/o fall x 1 week ago injuring right hip and pelvis x 1 week

## 2016-02-07 NOTE — ED Provider Notes (Signed)
[facility] Provider Note   CSN: [id number] Arrival date & time: 02/07/16  1805  By signing my name below, I, Dora Sims, attest that this documentation has been prepared under the direction and in the presence of physician practitioner, Deno Etienne, DO. Electronically Signed: Dora Sims, Scribe. 02/07/2016. 7:31 PM.  History   Chief Complaint Chief Complaint  Patient presents with  . Fall    The history is provided by the patient. No language interpreter was used.  Fall  This is a recurrent (due to epilepsy) problem. The current episode started more than 2 days ago. The problem occurs rarely. The problem has not changed since onset.Pertinent negatives include no chest pain, no abdominal pain, no headaches and no shortness of breath. Nothing aggravates the symptoms. Nothing relieves the symptoms.  Groin Pain  This is a new problem. The current episode started more than 2 days ago. The problem occurs constantly. The problem has been gradually worsening. Pertinent negatives include no chest pain, no abdominal pain, no headaches and no shortness of breath. The symptoms are aggravated by walking and standing (+applied pressure). Nothing relieves the symptoms. Treatments tried: hydrocodone and meloxicam. The treatment provided no relief.    HPI Comments: Erica Meyers is a 56 y.o. female with PMHx significant for epilepsy who presents to the Emergency Department complaining of constant, gradually worsening, right groin pain s/p falling one week ago. She states she was ambulating downstairs and lost consciousness briefly d/t an epileptic episode. She notes her pain is exacerbated by weight bearing, ambulation, and applied pressure to her right groin. Pt saw her PCP for the same several days ago and was prescribed Meloxicam and Hydrocodone with no improvement of her right groin pain. She denies abdominal pain, numbness/tingling, or any other associated symptoms.  Past Medical  History:  Diagnosis Date  . Epilepsy ([place])   . Hypertension     Patient Active Problem List   Diagnosis Date Noted  . Anxiety 10/31/2015  . Migraine without aura and without status migrainosus, not intractable 11/15/2014  . Nonintractable epilepsy without status epilepticus ([place]) 11/15/2014  . Complex partial seizure ([place]) 11/18/2012  . Migraine without aura 11/18/2012  . Unspecified essential hypertension 11/18/2012  . Drowsiness 11/18/2012    Past Surgical History:  Procedure Laterality Date  . CESAREAN SECTION    . TONSILLECTOMY    . TUBAL LIGATION      OB History    No data available       Home Medications    Prior to Admission medications   Medication Sig Start Date End Date Taking? Authorizing Provider  HYDROcodone-acetaminophen (NORCO/VICODIN) 5-325 MG tablet Take 1 tablet by mouth every 6 (six) hours as needed for moderate pain.   Yes Historical Provider, MD  meloxicam (MOBIC) 7.5 MG tablet Take 7.5 mg by mouth daily.   Yes Historical Provider, MD  atorvastatin (LIPITOR) 20 MG tablet Take 20 mg by mouth daily.    Historical Provider, MD  cholecalciferol (VITAMIN D) 1000 UNITS tablet Take 1,000 Units by mouth daily.    Historical Provider, MD  diazepam (VALIUM) 5 MG tablet Take 1 tablet (5 mg total) by mouth every 6 (six) hours as needed for anxiety (spasms). 02/07/16   Deno Etienne, DO  lamoTRIgine (LAMICTAL) 100 MG tablet One tab po qam and two tablets every night 10/31/15   Marcial Pacas, MD  lisinopril-hydrochlorothiazide (PRINZIDE,ZESTORETIC) 20-25 MG tablet TAKE 1 TABLET BY MOUTH ONCE A DAY (WILL PAY 3/14) 10/29/14   Historical Provider,  MD  Multiple Vitamin (MULTIVITAMIN WITH MINERALS) TABS tablet Take 1 tablet by mouth daily.    Historical Provider, MD  rizatriptan (MAXALT-MLT) 10 MG disintegrating tablet Take 1 tablet (10 mg total) by mouth as needed for migraine. May repeat in 2 hours if needed 10/31/15   Marcial Pacas, MD  sertraline (ZOLOFT) 50 MG tablet Take 1 tablet  (50 mg total) by mouth daily. 10/31/15   Marcial Pacas, MD  topiramate (TOPAMAX) 100 MG tablet Take 1 tablet (100 mg total) by mouth 2 (two) times daily. 10/31/15   Marcial Pacas, MD    Family History Family History  Problem Relation Age of Onset  . Hypertension Mother   . Stroke Mother     Social History Social History  Substance Use Topics  . Smoking status: Never Smoker  . Smokeless tobacco: Never Used  . Alcohol use No     Allergies   Penicillins   Review of Systems Review of Systems  Respiratory: Negative for shortness of breath.   Cardiovascular: Negative for chest pain.  Gastrointestinal: Negative for abdominal pain.  Musculoskeletal: Positive for myalgias (right groin).  Neurological: Positive for syncope (one episode). Negative for numbness and headaches.  All other systems reviewed and are negative.    Physical Exam Updated Vital Signs BP 148/95   Pulse 63   Temp 98.3 F (36.8 C)   Resp 18   Ht 5' 2.5" (1.588 m)   Wt 184 lb (83.5 kg)   LMP 01/05/2016   SpO2 99%   BMI 33.12 kg/m   Physical Exam  Constitutional: She is oriented to person, place, and time. She appears well-developed and well-nourished. No distress.  HENT:  Head: Normocephalic and atraumatic.  Eyes: EOM are normal. Pupils are equal, round, and reactive to light.  Neck: Normal range of motion. Neck supple.  Cardiovascular: Normal rate and regular rhythm.  Exam reveals no gallop and no friction rub.   No murmur heard. Pulmonary/Chest: Effort normal. She has no wheezes. She has no rales.  Abdominal: Soft. She exhibits no distension. There is no tenderness.  Musculoskeletal: She exhibits tenderness. She exhibits no edema.  TTP at the attachment of the adductor muscle to the pubic synthesis on the right  Neurological: She is alert and oriented to person, place, and time.  Skin: Skin is warm and dry. She is not diaphoretic.  Psychiatric: She has a normal mood and affect. Her behavior is normal.    Nursing note and vitals reviewed.    ED Treatments / Results  Labs (all labs ordered are listed, but only abnormal results are displayed) Labs Reviewed - No data to display  EKG  EKG Interpretation None       Radiology Dg Hip Unilat W Or Wo Pelvis 2-3 Views Right  Result Date: 02/07/2016 CLINICAL DATA:  56 y/o F; seizure with fall and worsening right hip pain. EXAM: DG HIP (WITH OR WITHOUT PELVIS) 2-3V RIGHT COMPARISON:  None. FINDINGS: There is no evidence of hip fracture or dislocation. There is no evidence of arthropathy or other focal bone abnormality. IMPRESSION: Negative. Electronically Signed   By: Kristine Garbe M.D.   On: 02/07/2016 18:57    Procedures Procedures (including critical care time)  DIAGNOSTIC STUDIES: Oxygen Saturation is 99% on RA, normal by my interpretation.    COORDINATION OF CARE: 7:38 PM Discussed treatment plan with pt at bedside and pt agreed to plan.  Medications Ordered in ED Medications - No data to display   Initial Impression /  Assessment and Plan / ED Course  I have reviewed the triage vital signs and the nursing notes.  Pertinent labs & imaging results that were available during my care of the patient were reviewed by me and considered in my medical decision making (see chart for details).  Clinical Course     56 yo F With a chief complaints of right groin pain. This happened after she had a seizure and fell down a step. This was about a week ago. Patient continues to have pain. Pain at the attachment of the abductor muscle. Suspect groin strain. Plain film negative. Discharge home.  11:09 PM:  I have discussed the diagnosis/risks/treatment options with the patient and family and believe the pt to be eligible for discharge home to follow-up with PCP. We also discussed returning to the ED immediately if new or worsening sx occur. We discussed the sx which are most concerning (e.g., sudden worsening pain, fever, inability to  tolerate by mouth) that necessitate immediate return. Medications administered to the patient during their visit and any new prescriptions provided to the patient are listed below.  Medications given during this visit Medications - No data to display   The patient appears reasonably screen and/or stabilized for discharge and I doubt any other medical condition or other [facility] requiring further screening, evaluation, or treatment in the ED at this time prior to discharge.    Final Clinical Impressions(s) / ED Diagnoses   Final diagnoses:  Groin strain, right, initial encounter    New Prescriptions Discharge Medication List as of 02/07/2016  7:40 PM    START taking these medications   Details  diazepam (VALIUM) 5 MG tablet Take 1 tablet (5 mg total) by mouth every 6 (six) hours as needed for anxiety (spasms)., Starting Tue 02/07/2016, Print        I personally performed the services described in this documentation, which was scribed in my presence. The recorded information has been reviewed and is accurate.    Deno Etienne, DO 02/07/16 2309

## 2016-04-30 ENCOUNTER — Telehealth: Payer: Self-pay | Admitting: Nurse Practitioner

## 2016-04-30 ENCOUNTER — Encounter: Payer: Self-pay | Admitting: Nurse Practitioner

## 2016-04-30 ENCOUNTER — Ambulatory Visit (INDEPENDENT_AMBULATORY_CARE_PROVIDER_SITE_OTHER): Payer: Medicare Other | Admitting: Nurse Practitioner

## 2016-04-30 VITALS — BP 127/76 | HR 86 | Resp 14 | Ht 63.0 in | Wt 170.0 lb

## 2016-04-30 DIAGNOSIS — G40209 Localization-related (focal) (partial) symptomatic epilepsy and epileptic syndromes with complex partial seizures, not intractable, without status epilepticus: Secondary | ICD-10-CM

## 2016-04-30 DIAGNOSIS — G43009 Migraine without aura, not intractable, without status migrainosus: Secondary | ICD-10-CM | POA: Diagnosis not present

## 2016-04-30 DIAGNOSIS — F329 Major depressive disorder, single episode, unspecified: Secondary | ICD-10-CM

## 2016-04-30 DIAGNOSIS — F32A Depression, unspecified: Secondary | ICD-10-CM

## 2016-04-30 NOTE — Telephone Encounter (Signed)
Pt called said she has a rash upper torso with numbness in hands and arms during the night since last spring. She doesn't know if it is caused by a medication. Please call
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
## 2016-04-30 NOTE — Patient Instructions (Signed)
Will check a lamotrigine level Continue Lamictal 100 mg in the morning 200 every night for now Continue Topamax 100 twice daily Continue Maxalt as needed acutely for headache Continue Zoloft 50 every morning Follow-up in 6 months

## 2016-04-30 NOTE — Progress Notes (Signed)
I have read the note, and I agree with the clinical assessment and plan.  Richard A. Sater, MD, PhD Certified in Neurology, Clinical Neurophysiology, Sleep Medicine, Pain Medicine and Neuroimaging  [facility] [address], [address] [place], [place] [address] [phone]  

## 2016-05-01 NOTE — Telephone Encounter (Signed)
Spoke with patient who stated she has had rash off and on for a long time. This RN advised she most likely would benefit from seeing a dermatologist for her rash.  Advised if rash was a medication side effect it would have manifested soon after starting a new medication. Regarding her numbness, this RN advised that she discuss with PCP as she is seen in this office for migraines. Patient stated she did mention to her PCP and meant to tell Erica Sauer, NP at her FU here yesterday. Patient stated this has been an ongoing problem for her. This RN advised she discuss possible treatments with PCP and if necessary, PCP can refer her here for new issue of numbness.  Patient verbalized understanding, appreciation, agreement.

## 2016-05-02 ENCOUNTER — Telehealth: Payer: Self-pay | Admitting: Nurse Practitioner

## 2016-05-02 LAB — LAMOTRIGINE LEVEL: Lamotrigine Lvl: 1.6 ug/mL — ABNORMAL LOW (ref 2.0–20.0)

## 2016-05-02 MED ORDER — LAMOTRIGINE 100 MG PO TABS: 200.0000 mg | ORAL_TABLET | Freq: Two times a day (BID) | ORAL | 6 refills | Status: DC

## 2016-05-02 NOTE — Telephone Encounter (Signed)
Lamictal level was less than 2 not therapeutic, please have patient increase to 2 tablets twice a day, new Rx called to pharmacy

## 2016-05-02 NOTE — Telephone Encounter (Signed)
Per Daun Peacock, NP, spoke with patient and informed her that her Lamictal level is 2, which is below therapeutic level. Informed her Hoyle Sauer sent in new Rx, and advised she is to take two tablets two times daily. Patient correctly repeated instructions back and verbalized understanding, agreement.

## 2016-05-22 ENCOUNTER — Encounter: Payer: Self-pay | Admitting: Physical Therapy

## 2016-05-22 ENCOUNTER — Ambulatory Visit: Payer: Medicare Other | Attending: Family Medicine | Admitting: Physical Therapy

## 2016-05-22 DIAGNOSIS — M62838 Other muscle spasm: Secondary | ICD-10-CM | POA: Diagnosis present

## 2016-05-22 DIAGNOSIS — M6281 Muscle weakness (generalized): Secondary | ICD-10-CM | POA: Insufficient documentation

## 2016-05-22 DIAGNOSIS — R293 Abnormal posture: Secondary | ICD-10-CM | POA: Insufficient documentation

## 2016-05-22 NOTE — Therapy (Addendum)
[facility] [address] [place], [place], [address] Phone: [phone]   Fax:  [phone]  Physical Therapy Evaluation  Patient Details  Name: Erica Meyers MRN: [id number] Date of Birth: [birth year] Referring Provider: Lujean Amel  Encounter Date: 05/22/2016      PT End of Session - 05/22/16 2012    Visit Number 1   Number of Visits 10   Date for PT Re-Evaluation 07/17/16   Authorization Type gcodes at 10th visit; KX at 15th visit   PT Start Time 1147   PT Stop Time 1231   PT Time Calculation (min) 44 min   Activity Tolerance Patient tolerated treatment well   Behavior During Therapy [facility] for tasks assessed/performed      Past Medical History:  Diagnosis Date  . Epilepsy ([place])   . Hypertension     Past Surgical History:  Procedure Laterality Date  . CESAREAN SECTION    . TONSILLECTOMY    . TUBAL LIGATION      There were no vitals filed for this visit.       Subjective Assessment - 05/22/16 1154    Subjective I fell down the stairs when I had a siezure.  I feel the outside of the hip more than the inner thigh.  I walk every day and on even ground, I feel groin on level ground.  On uneven ground the outsid eof the hip really hurts. Fell during the holidays and the groin has gotten a little better, but the hip on outside hasn't gotten better.   Pertinent History migrains,    Limitations Walking;Standing   How long can you walk comfortably? able to walk but has pain   Diagnostic tests x-ray (shows bilateral arthritis)   Patient Stated Goals be able to walk and feel stronger, stairs   Currently in Pain? Yes   Pain Score 4    Pain Location Hip   Pain Orientation Right;Lateral   Pain Descriptors / Indicators Burning  feel popping sometimes   Pain Type Acute pain   Pain Radiating Towards --   Pain Onset More than a month ago   Pain Frequency Intermittent   Aggravating Factors  stairs, walking on  uneven ground, adduction when lying down, lying on that side   Pain Relieving Factors none   Effect of Pain on Daily Activities walking and exercising   Multiple Pain Sites No            [facility] PT Assessment - 05/22/16 0001      Assessment   Medical Diagnosis S76.211A adductor strain right thigh   Referring Provider Dorthy Cooler, DIBAS   Onset Date/Surgical Date 01/20/16   Prior Therapy no     Precautions   Precautions Fall  has siezures     Restrictions   Weight Bearing Restrictions No     Balance Screen   Has the patient fallen in the past 6 months Yes   How many times? 2   Has the patient had a decrease in activity level because of a fear of falling?  No   Is the patient reluctant to leave their home because of a fear of falling?  No     Home Environment   Living Environment Private residence   Living Arrangements Children  son and daughter     Prior Function   Level of Independence Independent   Architect;On disability  take some classes   Leisure walking     Cognition  Overall Cognitive Status Within Functional Limits for tasks assessed     Observation/Other Assessments   Focus on Therapeutic Outcomes (FOTO)  60% limitation  goal 50% limitation     Posture/Postural Control   Posture/Postural Control Postural limitations   Postural Limitations Anterior pelvic tilt     ROM / Strength   AROM / PROM / Strength Strength     Strength   Strength Assessment Site Hip   Right/Left Hip Right   Right Hip Flexion 3/5   Right Hip Extension 3/5   Right Hip External Rotation  3-/5   Right Hip Internal Rotation 4+/5   Right Hip ABduction 2-/5   Right Hip ADduction 2-/5     Flexibility   Soft Tissue Assessment /Muscle Length yes   Hamstrings 25% limited   Obturator Internus tight     Palpation   Palpation comment tender to palpation: deep hip rotator attachments around right GT; right glute med, right adductors     Ambulation/Gait   Gait Pattern Lateral  trunk lean to right;Lateral hip instability                           PT Education - 05/22/16 2011    Education provided Yes   Education Details hamstring and adductor stretch   Person(s) Educated Patient   Methods Explanation;Demonstration;Verbal cues;Handout   Comprehension Verbalized understanding;Returned demonstration          PT Short Term Goals - 05/22/16 2050      PT SHORT TERM GOAL #1   Title pt will be independent with initial HEP   Time 4   Period Weeks   Status New     PT SHORT TERM GOAL #2   Title able to perform sit<> supine and rolling in bed with only mild pain due to increased LE strength   Time 4   Period Weeks   Status New     PT SHORT TERM GOAL #3   Title able to step up 6 inch step with right LE with good body mechanics due to increased strength and reduced pain   Time 4   Period Weeks   Status New     PT SHORT TERM GOAL #4   Title pain reduced by 25% overall during a typical day   Time 4   Period Weeks   Status New           PT Long Term Goals - 05/22/16 2050      PT LONG TERM GOAL #1   Title FOTO < or = to 50% limited   Time 8   Period Weeks   Status New     PT LONG TERM GOAL #2   Title ind with advanced HEP including walking 4 days/week as part of exercise routine   Time 8   Period Weeks   Status New     PT LONG TERM GOAL #3   Title pain reduced by 50% when walking in community on uneven surfaces   Time 8   Period Weeks   Status New     PT LONG TERM GOAL #4   Title pt will report only mild intensity of pain with going up and down one flight of stairs due to increased strength   Time 8   Status New               Plan - 05/22/16 2123    Clinical Impression Statement Patient presents to  clinic for low complexity eval due to condition that is stable.  Pt presents with pain that increases with functional activities such as walking and functional activities.  Pt has difficulty walking up stairs  reciprocally.  She has LE weakness from as low as 2/5 MMT.  Pt has decreased AROM in right hip.  She has tightness and muscle spasms in hamstring, adductors, hip rotators and glute med muscles.  Pt will benefit from skilled PT to address these impairments in order to return to full functional activities for community ambulation and return to exercise routine.   Rehab Potential Excellent   Clinical Impairments Affecting Rehab Potential fibromyalgia and chronic pain, siezures   PT Frequency 2x / week   PT Duration 8 weeks   PT Treatment/Interventions ADLs/Self Care Home Management;Biofeedback;Cryotherapy;Electrical Stimulation;Iontophoresis 27m/ml Dexamethasone;Moist Heat;Ultrasound;Gait training;Stair training;Therapeutic activities;Therapeutic exercise;Neuromuscular re-education;Balance training;Patient/family education;Manual techniques;Taping;Dry needling   PT Next Visit Plan fitzgerald's test, modalities for pain management, ROM, strength as tolerated   Recommended Other Services none at this time   Consulted and Agree with Plan of Care Patient      Patient will benefit from skilled therapeutic intervention in order to improve the following deficits and impairments:  Abnormal gait, Decreased strength, Increased muscle spasms, Pain  Visit Diagnosis: Other muscle spasm - Plan: PT plan of care cert/re-cert  Muscle weakness (generalized) - Plan: PT plan of care cert/re-cert  Abnormal posture - Plan: PT plan of care cert/re-cert      G-Codes - [id number]/[id number]/12226-Feb-2047   Functional Assessment Tool Used (Outpatient Only) FOTO and clinical assessment   Functional Limitation Mobility: Walking and moving around   Mobility: Walking and Moving Around Current Status [phone] At least 60 percent but less than 80 percent impaired, limited or restricted   Mobility: Walking and Moving Around Goal Status [phone] At least 40 percent but less than 60 percent impaired, limited or restricted       Problem  List Patient Active Problem List   Diagnosis Date Noted  . Anxiety 10/31/2015  . Migraine without aura and without status migrainosus, not intractable 11/15/2014  . Nonintractable epilepsy without status epilepticus ([place] 11/15/2014  . Complex partial seizure ([place] 11/18/2012  . Migraine without aura 11/18/2012  . Unspecified essential hypertension 11/18/2012  . Drowsiness 11/18/2012    JZannie Cove PT 05/22/2016, 9:59 PM  [facility] [address] [place] [address]Phone: [phone]  Fax:  [phone] Name: Erica AllardMRN: [id number] of Birth: 721-May-1962 PHYSICAL THERAPY DISCHARGE SUMMARY  Visits from Start of Care: 1  Current functional level related to goals / functional outcomes: See above, only came for eval   Remaining deficits: See above   Education / Equipment: HEP  Plan: Patient agrees to discharge.  Patient goals were not met. Patient is being discharged due to not returning since the last visit.  ?????         JGoogle PT 09/04/16 11:11 AM

## 2016-05-22 NOTE — Patient Instructions (Addendum)
Chair Sitting    Sit at edge of seat, spine straight, one leg extended. Put a hand on each thigh and bend forward from the hip, keeping spine straight. Allow hand on extended leg to reach toward toes. Support upper body with other arm. Hold __20_ seconds. Repeat _3__ times per session. Do _1__ sessions per day.  Copyright  VHI. All rights reserved.    Hip Adductors (Sit)    Sit with back supported and soles of feet together. Push down gently on inside of knees, spreading legs apart. Keep back straight. Do not raise buttocks. Hold __3__ seconds. Repeat __10__ times. Do __2__ sessions per day while reading or watching television. CAUTION: Stretch should be gentle, steady and slow.  Copyright  VHI. All rights reserved.

## 2016-06-12 ENCOUNTER — Encounter: Payer: Self-pay | Admitting: Endocrinology

## 2016-06-12 ENCOUNTER — Ambulatory Visit (INDEPENDENT_AMBULATORY_CARE_PROVIDER_SITE_OTHER): Payer: Medicare Other | Admitting: Endocrinology

## 2016-06-12 DIAGNOSIS — R519 Headache, unspecified: Secondary | ICD-10-CM

## 2016-06-12 DIAGNOSIS — F329 Major depressive disorder, single episode, unspecified: Secondary | ICD-10-CM | POA: Diagnosis not present

## 2016-06-12 DIAGNOSIS — D649 Anemia, unspecified: Secondary | ICD-10-CM | POA: Diagnosis not present

## 2016-06-12 DIAGNOSIS — R51 Headache: Secondary | ICD-10-CM

## 2016-06-12 DIAGNOSIS — F32A Depression, unspecified: Secondary | ICD-10-CM

## 2016-06-12 DIAGNOSIS — E785 Hyperlipidemia, unspecified: Secondary | ICD-10-CM

## 2016-06-12 DIAGNOSIS — E0590 Thyrotoxicosis, unspecified without thyrotoxic crisis or storm: Secondary | ICD-10-CM | POA: Diagnosis not present

## 2016-06-12 NOTE — Patient Instructions (Signed)

## 2016-06-12 NOTE — Progress Notes (Addendum)
Subjective:    Patient ID: Erica Meyers, female    DOB: 06/05/1960, 56 y.o.   MRN: [id number]  HPI Pt is referred by Dr Dorthy Cooler, for hyperthyroidism.  Pt reports he was dx'ed with hyperthyroidism in approx 2002.  she has never been on therapy for this.  she has never had XRT to the anterior neck, or thyroid surgery.  she says she may have had a nuc med scan in another state then, but does not recall result.  she does not consume kelp or any other prescribed or non-prescribed thyroid medication.  she has never been on amiodarone.  She has slight palpitations in the chest, and assoc fatigue.   Past Medical History:  Diagnosis Date  . Anemia   . Depression   . Dyslipidemia   . Epilepsy ([place])   . Headache   . Hypertension     Past Surgical History:  Procedure Laterality Date  . CESAREAN SECTION    . TONSILLECTOMY    . TUBAL LIGATION      Social History   Social History  . Marital status: Single    Spouse name: N/A  . Number of children: N/A  . Years of education: N/A   Occupational History  . Not on file.   Social History Main Topics  . Smoking status: Never Smoker  . Smokeless tobacco: Never Used  . Alcohol use No  . Drug use: No  . Sexual activity: Not on file   Other Topics Concern  . Not on file   Social History Narrative  . No narrative on file    Current Outpatient Prescriptions on File Prior to Visit  Medication Sig Dispense Refill  . atorvastatin (LIPITOR) 20 MG tablet Take 20 mg by mouth daily.    . cholecalciferol (VITAMIN D) 1000 UNITS tablet Take 1,000 Units by mouth daily.    Marland Kitchen lamoTRIgine (LAMICTAL) 100 MG tablet Take 2 tablets (200 mg total) by mouth 2 (two) times daily. 120 tablet 6  . lisinopril-hydrochlorothiazide (PRINZIDE,ZESTORETIC) 20-25 MG tablet TAKE 1 TABLET BY MOUTH ONCE A DAY (WILL PAY 3/14)  11  . meloxicam (MOBIC) 7.5 MG tablet Take 7.5 mg by mouth daily.    . Multiple Vitamin (MULTIVITAMIN WITH MINERALS) TABS tablet Take 1 tablet by  mouth daily.    . rizatriptan (MAXALT-MLT) 10 MG disintegrating tablet Take 1 tablet (10 mg total) by mouth as needed for migraine. May repeat in 2 hours if needed 12 tablet 11  . sertraline (ZOLOFT) 50 MG tablet Take 1 tablet (50 mg total) by mouth daily. 90 tablet 4  . topiramate (TOPAMAX) 100 MG tablet Take 1 tablet (100 mg total) by mouth 2 (two) times daily. 180 tablet 4   No current facility-administered medications on file prior to visit.     Allergies  Allergen Reactions  . Penicillins     childhood    Family History  Problem Relation Age of Onset  . Hypertension Mother   . Stroke Mother   . Thyroid disease Mother     BP 118/80   Pulse 80   Ht 5\' 3"  (1.6 m)   Wt 176 lb (79.8 kg)   BMI 31.18 kg/m    Review of Systems denies hoarseness, diplopia, sob, diarrhea, polyuria, muscle weakness, excessive diaphoresis, tremor, heat intolerance, and rhinorrhea. She has weight gain, hair loss, easy bruising, headache, anxiety, and insomnia.      Objective:   Physical Exam VS: see vs page GEN: no distress HEAD: head:  no deformity eyes: no periorbital swelling, no proptosis external nose and ears are normal mouth: no lesion seen NECK: 2-3 cm RUP thyroid nodule CHEST WALL: no deformity LUNGS: clear to auscultation CV: reg rate and rhythm, no murmur ABD: abdomen is soft, nontender.  no hepatosplenomegaly.  not distended.  no hernia MUSCULOSKELETAL: muscle bulk and strength are grossly normal.  no obvious joint swelling.  gait is normal and steady EXTEMITIES: no deformity.  no ulcer on the feet.  feet are of normal color and temp.  no edema PULSES: dorsalis pedis intact bilat.  no carotid bruit NEURO:  cn 2-12 grossly intact.   readily moves all 4's.  sensation is intact to touch on the feet.  No tremor SKIN:  Normal texture and temperature.  No rash or suspicious lesion is visible.  Not diaphoretic  NODES:  None palpable at the neck PSYCH: alert, well-oriented.  Does not  appear anxious nor depressed.  I have reviewed outside records, and summarized: Pt was noted to have suppressed TSH, and referred here.  Main probs addressed were acral numbness, grain pain, and rash.    outside test results are reviewed: TSH=0.07    Assessment & Plan:  Nodular thyroid, new to me.  Hyperthyroidism, prob due to the nodules.  We discussed rx options. She chooses RAI.   Patient Instructions  let's check a thyroid "scan" (a special, but easy and painless type of thyroid x ray).  It works like this: you go to the x-ray department of the hospital to swallow a pill, which contains a miniscule amount of radiation.  You will not notice any symptoms from this.  You will go back to the x-ray department the next day, to lie down in front of a camera.  The results of this will be sent to me.   Based on the results, i hope to order for you a treatment pill of radioactive iodine.  Although it is a larger amount of radiation, you will again notice no symptoms from this.  The pill is gone from your body in a few days (during which you should stay away from other people), but takes several months to work.  Therefore, please return here approximately 6-8 weeks after the treatment.  This treatment has been available for many years, and the only known side-effect is an underactive thyroid.  It is possible that i would eventually prescribe for you a thyroid hormone pill, which is very inexpensive.  You don't have to worry about side-effects of this thyroid hormone pill, because it is the same molecule your thyroid makes.

## 2016-06-13 ENCOUNTER — Encounter: Payer: Self-pay | Admitting: Endocrinology

## 2016-06-13 DIAGNOSIS — F329 Major depressive disorder, single episode, unspecified: Secondary | ICD-10-CM | POA: Insufficient documentation

## 2016-06-13 DIAGNOSIS — E785 Hyperlipidemia, unspecified: Secondary | ICD-10-CM | POA: Insufficient documentation

## 2016-06-13 DIAGNOSIS — R51 Headache: Secondary | ICD-10-CM

## 2016-06-13 DIAGNOSIS — F32A Depression, unspecified: Secondary | ICD-10-CM | POA: Insufficient documentation

## 2016-06-13 DIAGNOSIS — D649 Anemia, unspecified: Secondary | ICD-10-CM | POA: Insufficient documentation

## 2016-06-13 DIAGNOSIS — R519 Headache, unspecified: Secondary | ICD-10-CM | POA: Insufficient documentation

## 2016-07-03 ENCOUNTER — Encounter (HOSPITAL_COMMUNITY)
Admission: RE | Admit: 2016-07-03 | Discharge: 2016-07-03 | Disposition: A | Discharge status: Home / Self Care | Payer: Medicare Other | Source: Ambulatory Visit | Attending: Endocrinology | Admitting: Endocrinology

## 2016-07-03 DIAGNOSIS — E0590 Thyrotoxicosis, unspecified without thyrotoxic crisis or storm: Secondary | ICD-10-CM

## 2016-07-04 ENCOUNTER — Encounter (HOSPITAL_COMMUNITY)
Admission: RE | Admit: 2016-07-04 | Discharge: 2016-07-04 | Disposition: A | Discharge status: Home / Self Care | Payer: Medicare Other | Source: Ambulatory Visit | Attending: Endocrinology | Admitting: Endocrinology

## 2016-07-04 ENCOUNTER — Other Ambulatory Visit: Payer: Self-pay | Admitting: Endocrinology

## 2016-07-04 DIAGNOSIS — E0590 Thyrotoxicosis, unspecified without thyrotoxic crisis or storm: Secondary | ICD-10-CM

## 2016-07-04 MED ORDER — SODIUM PERTECHNETATE TC 99M INJECTION
10.0000 | Freq: Once | INTRAVENOUS | Status: AC | PRN
  Administered 2016-07-04: 10 via INTRAVENOUS

## 2016-07-10 ENCOUNTER — Ambulatory Visit
Admission: RE | Admit: 2016-07-10 | Discharge: 2016-07-10 | Disposition: A | Discharge status: Home / Self Care | Payer: Medicare Other | Source: Ambulatory Visit | Attending: Endocrinology | Admitting: Endocrinology

## 2016-07-10 DIAGNOSIS — E0590 Thyrotoxicosis, unspecified without thyrotoxic crisis or storm: Secondary | ICD-10-CM

## 2016-07-11 ENCOUNTER — Other Ambulatory Visit: Payer: Self-pay | Admitting: Endocrinology

## 2016-07-11 DIAGNOSIS — E0590 Thyrotoxicosis, unspecified without thyrotoxic crisis or storm: Secondary | ICD-10-CM

## 2016-07-20 ENCOUNTER — Ambulatory Visit (HOSPITAL_COMMUNITY)
Admission: RE | Admit: 2016-07-20 | Discharge: 2016-07-20 | Disposition: A | Discharge status: Home / Self Care | Payer: Medicare Other | Source: Ambulatory Visit | Attending: Endocrinology | Admitting: Endocrinology

## 2016-07-20 DIAGNOSIS — E0590 Thyrotoxicosis, unspecified without thyrotoxic crisis or storm: Secondary | ICD-10-CM

## 2016-07-20 DIAGNOSIS — E0520 Thyrotoxicosis with toxic multinodular goiter without thyrotoxic crisis or storm: Secondary | ICD-10-CM | POA: Insufficient documentation

## 2016-07-20 LAB — HCG, QUANTITATIVE, PREGNANCY: HCG, BETA CHAIN, QUANT, S: 4 m[IU]/mL (ref <5)

## 2016-07-20 MED ORDER — SODIUM IODIDE I 131 CAPSULE: 40.0000 | Freq: Once | INTRAVENOUS | Status: DC | PRN

## 2016-08-08 ENCOUNTER — Ambulatory Visit (INDEPENDENT_AMBULATORY_CARE_PROVIDER_SITE_OTHER): Payer: Medicare Other | Admitting: Endocrinology

## 2016-08-08 ENCOUNTER — Encounter: Payer: Self-pay | Admitting: Endocrinology

## 2016-08-08 DIAGNOSIS — M542 Cervicalgia: Secondary | ICD-10-CM | POA: Diagnosis not present

## 2016-08-08 LAB — CBC WITH DIFFERENTIAL/PLATELET
BASOS ABS: 0 10*3/uL (ref 0.0–0.1)
Basophils Relative: 0.7 % (ref 0.0–3.0)
EOS PCT: 2.5 % (ref 0.0–5.0)
Eosinophils Absolute: 0.1 10*3/uL (ref 0.0–0.7)
HEMATOCRIT: 42.2 % (ref 36.0–46.0)
Hemoglobin: 14.1 g/dL (ref 12.0–15.0)
Lymphocytes Relative: 23.8 % (ref 12.0–46.0)
Lymphs Abs: 1 10*3/uL (ref 0.7–4.0)
MCHC: 33.3 g/dL (ref 30.0–36.0)
MCV: 90.7 fl (ref 78.0–100.0)
MONOS PCT: 13.2 % — AB (ref 3.0–12.0)
Monocytes Absolute: 0.6 10*3/uL (ref 0.1–1.0)
NEUTROS ABS: 2.5 10*3/uL (ref 1.4–7.7)
Neutrophils Relative %: 59.8 % (ref 43.0–77.0)
Platelets: 240 10*3/uL (ref 150.0–400.0)
RBC: 4.65 Mil/uL (ref 3.87–5.11)
RDW: 13.7 % (ref 11.5–15.5)
WBC: 4.2 10*3/uL (ref 4.0–10.5)

## 2016-08-08 LAB — TSH: TSH: <0.01 u[IU]/mL — ABNORMAL LOW (ref 0.35–4.50)

## 2016-08-08 LAB — SEDIMENTATION RATE: SED RATE: 48 mm/h — AB (ref 0–30)

## 2016-08-08 LAB — AMYLASE: Amylase: 67 U/L (ref 27–131)

## 2016-08-08 LAB — T4, FREE: FREE T4: 1.49 ng/dL (ref 0.60–1.60)

## 2016-08-08 NOTE — Patient Instructions (Addendum)
blood tests are requested for you today.  We'll let you know about the results. Please see an ear-nose-throat specialist.  you will receive a phone call, about a day and time for an appointment Please come back for a follow-up appointment in 6 weeks.

## 2016-08-08 NOTE — Progress Notes (Signed)
Subjective:    Patient ID: Erica Meyers, female    DOB: 05-23-1960, 56 y.o.   MRN: [id number]  HPI Pt returns for f/u of hyperthyroidism (due to multinodular goiter; dx'ed 2002; she had never been on therapy for this, until recent RAI; she had RAI in June, 2018).  Pt reports 3 weeks of moderate pain at the right side of the face, and assoc difficulty with swallowing.   Past Medical History:  Diagnosis Date  . Anemia   . Depression   . Dyslipidemia   . Epilepsy ([place])   . Headache   . Hypertension     Past Surgical History:  Procedure Laterality Date  . CESAREAN SECTION    . TONSILLECTOMY    . TUBAL LIGATION      Social History   Social History  . Marital status: Single    Spouse name: N/A  . Number of children: N/A  . Years of education: N/A   Occupational History  . Not on file.   Social History Main Topics  . Smoking status: Never Smoker  . Smokeless tobacco: Never Used  . Alcohol use No  . Drug use: No  . Sexual activity: Not on file   Other Topics Concern  . Not on file   Social History Narrative  . No narrative on file    Current Outpatient Prescriptions on File Prior to Visit  Medication Sig Dispense Refill  . atorvastatin (LIPITOR) 20 MG tablet Take 20 mg by mouth daily.    . cholecalciferol (VITAMIN D) 1000 UNITS tablet Take 1,000 Units by mouth daily.    Marland Kitchen lamoTRIgine (LAMICTAL) 100 MG tablet Take 2 tablets (200 mg total) by mouth 2 (two) times daily. 120 tablet 6  . lisinopril-hydrochlorothiazide (PRINZIDE,ZESTORETIC) 20-25 MG tablet TAKE 1 TABLET BY MOUTH ONCE A DAY (WILL PAY 3/14)  11  . meloxicam (MOBIC) 7.5 MG tablet Take 7.5 mg by mouth daily.    . Multiple Vitamin (MULTIVITAMIN WITH MINERALS) TABS tablet Take 1 tablet by mouth daily.    . rizatriptan (MAXALT-MLT) 10 MG disintegrating tablet Take 1 tablet (10 mg total) by mouth as needed for migraine. May repeat in 2 hours if needed 12 tablet 11  . sertraline (ZOLOFT) 50 MG tablet Take 1  tablet (50 mg total) by mouth daily. 90 tablet 4  . topiramate (TOPAMAX) 100 MG tablet Take 1 tablet (100 mg total) by mouth 2 (two) times daily. 180 tablet 4   No current facility-administered medications on file prior to visit.     Allergies  Allergen Reactions  . Penicillins     childhood    Family History  Problem Relation Age of Onset  . Hypertension Mother   . Stroke Mother   . Thyroid disease Mother     BP 122/82   Pulse 75   Ht 5\' 3"  (1.6 m)   Wt 175 lb (79.4 kg)   SpO2 95%   BMI 31.00 kg/m   Review of Systems Denies fever and visual loss.     Objective:   Physical Exam VITAL SIGNS:  See vs page GENERAL: no distress head: no deformity  eyes: no periorbital swelling, no proptosis.   external nose and ears are normal.  mouth: no lesion seen.   NECK: 2 right sided nodules are palpable.  No swell/tend/warmth/erythema, but there is guarding to palpation at the right anterolat neck.      Assessment & Plan:  Hyperthyroidism, due for recheck after RAI.  Facial pain, new, uncertain etiology.     Patient Instructions  blood tests are requested for you today.  We'll let you know about the results. Please see an ear-nose-throat specialist.  you will receive a phone call, about a day and time for an appointment Please come back for a follow-up appointment in 6 weeks.

## 2016-09-19 ENCOUNTER — Encounter: Payer: Self-pay | Admitting: Endocrinology

## 2016-09-19 ENCOUNTER — Ambulatory Visit (INDEPENDENT_AMBULATORY_CARE_PROVIDER_SITE_OTHER): Payer: Medicare Other | Admitting: Endocrinology

## 2016-09-19 VITALS — BP 110/70 | HR 90 | Wt 172.8 lb

## 2016-09-19 DIAGNOSIS — E0590 Thyrotoxicosis, unspecified without thyrotoxic crisis or storm: Secondary | ICD-10-CM | POA: Diagnosis not present

## 2016-09-19 LAB — T4, FREE: Free T4: 0.72 ng/dL (ref 0.60–1.60)

## 2016-09-19 LAB — TSH: TSH: 4.3 u[IU]/mL (ref 0.35–4.50)

## 2016-09-19 NOTE — Patient Instructions (Addendum)
blood tests are requested for you today.  We'll let you know about the results.  Please come back for a follow-up appointment in 6 weeks.  

## 2016-09-19 NOTE — Progress Notes (Signed)
   Subjective:    Patient ID: Erica Meyers, female    DOB: 11/23/1960, 56 y.o.   MRN: [id number]  HPI Pt returns for f/u of hyperthyroidism (due to multinodular goiter; dx'ed 2002; she had RAI in June, 2018).  pt states she feels well in general.  Past Medical History:  Diagnosis Date  . Anemia   . Depression   . Dyslipidemia   . Epilepsy ([place])   . Headache   . Hypertension     Past Surgical History:  Procedure Laterality Date  . CESAREAN SECTION    . TONSILLECTOMY    . TUBAL LIGATION      Social History   Social History  . Marital status: Single    Spouse name: N/A  . Number of children: N/A  . Years of education: N/A   Occupational History  . Not on file.   Social History Main Topics  . Smoking status: Never Smoker  . Smokeless tobacco: Never Used  . Alcohol use No  . Drug use: No  . Sexual activity: Not on file   Other Topics Concern  . Not on file   Social History Narrative  . No narrative on file    Current Outpatient Prescriptions on File Prior to Visit  Medication Sig Dispense Refill  . atorvastatin (LIPITOR) 20 MG tablet Take 20 mg by mouth daily.    . cholecalciferol (VITAMIN D) 1000 UNITS tablet Take 1,000 Units by mouth daily.    Marland Kitchen lamoTRIgine (LAMICTAL) 100 MG tablet Take 2 tablets (200 mg total) by mouth 2 (two) times daily. 120 tablet 6  . lisinopril-hydrochlorothiazide (PRINZIDE,ZESTORETIC) 20-25 MG tablet TAKE 1 TABLET BY MOUTH ONCE A DAY (WILL PAY 3/14)  11  . meloxicam (MOBIC) 7.5 MG tablet Take 7.5 mg by mouth daily.    . Multiple Vitamin (MULTIVITAMIN WITH MINERALS) TABS tablet Take 1 tablet by mouth daily.    . rizatriptan (MAXALT-MLT) 10 MG disintegrating tablet Take 1 tablet (10 mg total) by mouth as needed for migraine. May repeat in 2 hours if needed 12 tablet 11  . sertraline (ZOLOFT) 50 MG tablet Take 1 tablet (50 mg total) by mouth daily. 90 tablet 4  . topiramate (TOPAMAX) 100 MG tablet Take 1 tablet (100 mg total) by mouth 2  (two) times daily. 180 tablet 4   No current facility-administered medications on file prior to visit.     Allergies  Allergen Reactions  . Penicillins     childhood    Family History  Problem Relation Age of Onset  . Hypertension Mother   . Stroke Mother   . Thyroid disease Mother     BP 110/70   Pulse 90   Wt 172 lb 12.8 oz (78.4 kg)   SpO2 97%   BMI 30.61 kg/m    Review of Systems Denies weight change.      Objective:   Physical Exam VITAL SIGNS:  See vs page GENERAL: no distress head: no deformity  eyes: no periorbital swelling, no proptosis.   external nose and ears are normal.  mouth: no lesion seen.   NECK: 2 right sided nodules are again palpable.      Assessment & Plan:  Hyperthyroidism, due for recheck.    Patient Instructions  blood tests are requested for you today.  We'll let you know about the results.  Please come back for a follow-up appointment in 6 weeks.

## 2016-09-28 ENCOUNTER — Telehealth: Payer: Self-pay | Admitting: Endocrinology

## 2016-09-28 NOTE — Telephone Encounter (Signed)
Patient has a rash on her neck and a swollen neck and she thinks it is her thyroid. Patient is scheduled to see her PCP today. Call patient to advise further if needed, be on stand by for the update from her PCP.

## 2016-10-06 ENCOUNTER — Encounter (HOSPITAL_COMMUNITY): Payer: Self-pay | Admitting: Emergency Medicine

## 2016-10-06 ENCOUNTER — Ambulatory Visit (HOSPITAL_COMMUNITY)
Admission: EM | Admit: 2016-10-06 | Discharge: 2016-10-06 | Disposition: A | Discharge status: Home / Self Care | Payer: Medicare Other | Attending: Emergency Medicine | Admitting: Emergency Medicine

## 2016-10-06 DIAGNOSIS — L249 Irritant contact dermatitis, unspecified cause: Secondary | ICD-10-CM

## 2016-10-06 MED ORDER — TRIAMCINOLONE ACETONIDE 0.1 % EX CREA: 1.0000 "application " | TOPICAL_CREAM | Freq: Two times a day (BID) | CUTANEOUS | 0 refills | Status: DC

## 2016-10-06 NOTE — ED Triage Notes (Addendum)
Rash approx 1 1/2 weeks ago.  3 months ago started adenomas treatment  Patient has been on prednisone for rash and swelling-last dose of prednisone was Thursday  Patient reports sore throat and headache.  Rash is to the right side of her neck

## 2016-10-06 NOTE — ED Provider Notes (Signed)
[facility]    CSN: [id number] Arrival date & time: 10/06/16  1200     History   Chief Complaint Chief Complaint  Patient presents with  . Rash    HPI Erica Meyers is a 56 y.o. female.   56 year old female presents to the urgent care with concerns of a itchy rash to the right side of her neck and face that she developed one half weeks ago. She  describes it as a blistering papular itchy rash. She saw her PCP about 9 days ago and was treated with oral prednisone. She states it has improved somewhat and has decreased in size as well as the surrounding swelling which has nearly abated. She has also been applying vinegar to help dry up the lesions. She now has a minor scratchy sore throat. Denies fevers or chills but says she has been feeling cold and sweating. She is also introduced complaints or concerns about her thyroid and adenoma of the thyroid. She is being evaluated by her PCP at this time for this       Past Medical History:  Diagnosis Date  . Anemia   . Depression   . Dyslipidemia   . Epilepsy ([place])   . Headache   . Hypertension     Patient Active Problem List   Diagnosis Date Noted  . Neck pain 08/08/2016  . Dyslipidemia   . Depression   . Headache   . Anemia   . Hyperthyroidism 06/12/2016  . Anxiety 10/31/2015  . Migraine without aura and without status migrainosus, not intractable 11/15/2014  . 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 Complex partial seizure ([place]) 11/18/2012  . Migraine without aura 11/18/2012  . Unspecified essential hypertension 11/18/2012  . Drowsiness 11/18/2012    Past Surgical History:  Procedure Laterality Date  . CESAREAN SECTION    . TONSILLECTOMY    . TUBAL LIGATION      OB History    No data available       Home Medications    Prior to Admission medications   Medication Sig Start Date End Date Taking? Authorizing Provider  atorvastatin (LIPITOR) 20 MG tablet Take 20 mg  by mouth daily.    [provider]  cholecalciferol (VITAMIN D) 1000 UNITS tablet Take 1,000 Units by mouth daily.    [provider]  lamoTRIgine (LAMICTAL) 100 MG tablet Take 2 tablets (200 mg total) by mouth 2 (two) times daily. 05/02/16   Dennie Bible, NP  lisinopril-hydrochlorothiazide (PRINZIDE,ZESTORETIC) 20-25 MG tablet TAKE 1 TABLET BY MOUTH ONCE A DAY (WILL PAY 3/14) 10/29/14   [provider]  meloxicam (MOBIC) 7.5 MG tablet Take 7.5 mg by mouth daily.    [provider]  Multiple Vitamin (MULTIVITAMIN WITH MINERALS) TABS tablet Take 1 tablet by mouth daily.    [provider]  rizatriptan (MAXALT-MLT) 10 MG disintegrating tablet Take 1 tablet (10 mg total) by mouth as needed for migraine. May repeat in 2 hours if needed 10/31/15   Marcial Pacas, MD  sertraline (ZOLOFT) 50 MG tablet Take 1 tablet (50 mg total) by mouth daily. 10/31/15   Marcial Pacas, MD  topiramate (TOPAMAX) 100 MG tablet Take 1 tablet (100 mg total) by mouth 2 (two) times daily. 10/31/15   Marcial Pacas, MD  triamcinolone cream (KENALOG) 0.1 % Apply 1 application topically 2 (two) times daily. 10/06/16   Janne Napoleon, NP    Family History Family History  Problem Relation Age of  Onset  . Hypertension Mother   . Stroke Mother   . Thyroid disease Mother     Social History Social History  Substance Use Topics  . Smoking status: Never Smoker  . Smokeless tobacco: Never Used  . Alcohol use No     Allergies   Penicillins   Review of Systems Review of Systems  Constitutional: Negative.   HENT: Positive for sore throat.   Respiratory: Negative.   Cardiovascular: Negative.   Gastrointestinal: Negative.   Skin: Positive for rash.  Neurological: Negative.   All other systems reviewed and are negative.    Physical Exam Triage Vital Signs ED Triage Vitals  Enc Vitals Group     BP 10/06/16 1215 115/77     Pulse Rate 10/06/16 1215 85     Resp 10/06/16 1215 16      Temp 10/06/16 1215 98.3 F (36.8 C)     Temp Source 10/06/16 1215 Oral     SpO2 10/06/16 1215 97 %     Weight --      Height --      Head Circumference --      Peak Flow --      Pain Score 10/06/16 1213 5     Pain Loc --      Pain Edu? --      Excl. in [place]? --    No data found.   Updated Vital Signs BP 115/77 (BP Location: Left Arm)   Pulse 85   Temp 98.3 F (36.8 C) (Oral)   Resp 16   SpO2 97%   Visual Acuity Right Eye Distance:   Left Eye Distance:   Bilateral Distance:    Right Eye Near:   Left Eye Near:    Bilateral Near:     Physical Exam  Constitutional: She is oriented to person, place, and time. She appears well-developed and well-nourished. No distress.  HENT:  Oropharynx with light blotchy type erythema but no swelling or signs of acute infection. No exudates.  Eyes: EOM are normal.  Neck: Normal range of motion. Neck supple.  No local cervical adenopathy or swelling appreciated.  Cardiovascular: Normal rate.   Pulmonary/Chest: Effort normal. No respiratory distress.  Musculoskeletal: She exhibits no edema.  Lymphadenopathy:    She has no cervical adenopathy.  Neurological: She is alert and oriented to person, place, and time. She exhibits normal muscle tone.  Skin: Skin is warm and dry.  There are a few lesions to the right side of the neck and face described is primarily papular this time previously described is vesicular, linear fashion an isolated papules. These appear to be in a state of healing. Currently no drainage or erythema. No signs of localized infection.  Psychiatric: She has a normal mood and affect.  Nursing note and vitals reviewed.    UC Treatments / Results  Labs (all labs ordered are listed, but only abnormal results are displayed) Labs Reviewed - No data to display  EKG  EKG Interpretation None       Radiology No results found.  Procedures Procedures (including critical care time)  Medications Ordered in  UC Medications - No data to display   Initial Impression / Assessment and Plan / UC Course  I have reviewed the triage vital signs and the nursing notes.  Pertinent labs & imaging results that were available during my care of the patient were reviewed by me and considered in my medical decision making (see chart for details).  This is likely a contact type dermatitis in which some irritant female coming contact with her skin. It appears to be improving however triamcinolone cream may extend the treatment and further improve this area with a rash. May also want to apply Caladryl or calamine lotion to help with drying effects. Follow-up with primary care doctor as needed.    Final Clinical Impressions(s) / UC Diagnoses   Final diagnoses:  Irritant contact dermatitis, unspecified trigger    New Prescriptions New Prescriptions   TRIAMCINOLONE CREAM (KENALOG) 0.1 %    Apply 1 application topically 2 (two) times daily.     Controlled Substance Prescriptions [place] Controlled Substance Registry consulted? Not Applicable   Janne Napoleon, NP 10/06/16 1244

## 2016-10-06 NOTE — Discharge Instructions (Signed)
This is likely a contact type dermatitis in which some irritant female coming contact with her skin. It appears to be improving however triamcinolone cream may extend the treatment and further improve this area with a rash. May also want to apply Caladryl or calamine lotion to help with drying effects. Follow-up with primary care doctor as needed.
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
## 2016-10-31 ENCOUNTER — Ambulatory Visit (INDEPENDENT_AMBULATORY_CARE_PROVIDER_SITE_OTHER): Payer: Medicare Other | Admitting: Nurse Practitioner

## 2016-10-31 ENCOUNTER — Encounter: Payer: Self-pay | Admitting: Nurse Practitioner

## 2016-10-31 VITALS — BP 121/74 | HR 77 | Ht 63.0 in | Wt 177.0 lb

## 2016-10-31 DIAGNOSIS — G40909 Epilepsy, unspecified, not intractable, without status epilepticus: Secondary | ICD-10-CM

## 2016-10-31 DIAGNOSIS — Z5181 Encounter for therapeutic drug level monitoring: Secondary | ICD-10-CM

## 2016-10-31 DIAGNOSIS — G43009 Migraine without aura, not intractable, without status migrainosus: Secondary | ICD-10-CM

## 2016-10-31 MED ORDER — LAMOTRIGINE 100 MG PO TABS: 200.0000 mg | ORAL_TABLET | Freq: Two times a day (BID) | ORAL | 6 refills | Status: DC

## 2016-10-31 MED ORDER — SERTRALINE HCL 50 MG PO TABS: 50.0000 mg | ORAL_TABLET | Freq: Every day | ORAL | 4 refills | Status: DC

## 2016-10-31 MED ORDER — TOPIRAMATE 100 MG PO TABS: 100.0000 mg | ORAL_TABLET | Freq: Two times a day (BID) | ORAL | 4 refills | Status: DC

## 2016-10-31 MED ORDER — RIZATRIPTAN BENZOATE 10 MG PO TBDP: 10.0000 mg | ORAL_TABLET | ORAL | 11 refills | Status: DC | PRN

## 2016-10-31 NOTE — Patient Instructions (Signed)
Will check a lamotrigine level due to visual complaints Continue Lamictal 200 mg in the morning 200 every night for now Continue Topamax 100 twice daily Continue Maxalt as needed acutely for headache/migraine Continue Zoloft 50 every morning Follow-up in 6 months Call for increase in headaches or staring spells

## 2016-10-31 NOTE — Progress Notes (Signed)
I have reviewed and agreed above plan. 

## 2016-11-01 ENCOUNTER — Telehealth: Payer: Self-pay | Admitting: Nurse Practitioner

## 2016-11-01 LAB — LAMOTRIGINE LEVEL: LAMOTRIGINE LVL: 2 ug/mL (ref 2.0–20.0)

## 2016-11-01 NOTE — Telephone Encounter (Signed)
Please let patient know Lamictal level was therapeutic Continue Lamictal200 mg twice daily. Continue Topamax 100 mg twice daily which is also a seizure drug

## 2016-11-02 NOTE — Telephone Encounter (Signed)
LMVM for pt on her mobile (ok per dpr), that her lab results came back , her lamictal level was in therapeutic range, keep lamictal 200mg  po BID and topmax 100mg  po bid (which is also used for sz).  Pt to call back if questions.

## 2016-11-16 ENCOUNTER — Emergency Department (HOSPITAL_COMMUNITY): Payer: Medicare Other

## 2016-11-16 ENCOUNTER — Encounter (HOSPITAL_COMMUNITY): Payer: Self-pay | Admitting: Nurse Practitioner

## 2016-11-16 DIAGNOSIS — R079 Chest pain, unspecified: Secondary | ICD-10-CM | POA: Diagnosis present

## 2016-11-16 DIAGNOSIS — I10 Essential (primary) hypertension: Secondary | ICD-10-CM | POA: Diagnosis not present

## 2016-11-16 DIAGNOSIS — R091 Pleurisy: Secondary | ICD-10-CM | POA: Diagnosis not present

## 2016-11-16 DIAGNOSIS — Z79899 Other long term (current) drug therapy: Secondary | ICD-10-CM | POA: Diagnosis not present

## 2016-11-16 LAB — I-STAT TROPONIN, ED: TROPONIN I, POC: 0 ng/mL (ref 0.00–0.08)

## 2016-11-16 LAB — CBC
HCT: 42.9 % (ref 36.0–46.0)
Hemoglobin: 13.9 g/dL (ref 12.0–15.0)
MCH: 30.3 pg (ref 26.0–34.0)
MCHC: 32.4 g/dL (ref 30.0–36.0)
MCV: 93.5 fL (ref 78.0–100.0)
PLATELETS: 291 10*3/uL (ref 150–400)
RBC: 4.59 MIL/uL (ref 3.87–5.11)
RDW: 14.4 % (ref 11.5–15.5)
WBC: 7.6 10*3/uL (ref 4.0–10.5)

## 2016-11-16 LAB — BASIC METABOLIC PANEL
Anion gap: 10 (ref 5–15)
BUN: 18 mg/dL (ref 6–20)
CALCIUM: 9.3 mg/dL (ref 8.9–10.3)
CO2: 22 mmol/L (ref 22–32)
Chloride: 102 mmol/L (ref 101–111)
Creatinine, Ser: 0.99 mg/dL (ref 0.44–1.00)
GFR calc Af Amer: >60 mL/min (ref >60)
Glucose, Bld: 93 mg/dL (ref 65–99)
Potassium: 3.7 mmol/L (ref 3.5–5.1)
SODIUM: 134 mmol/L — AB (ref 135–145)

## 2016-11-16 NOTE — ED Notes (Signed)
Visitor updated on wait for treatment room.

## 2016-11-16 NOTE — ED Triage Notes (Signed)
Pt sts was taking a shower this evening and had sudden onset of squeezing chest pain and shob. Patient states pain has been intermittent since then. Pt endorses nausea and dizziness and diaphoresis with episode that lasted approximately 30 minutes- pt sts pain still comes and goes.

## 2016-11-17 ENCOUNTER — Other Ambulatory Visit: Payer: Self-pay

## 2016-11-17 ENCOUNTER — Other Ambulatory Visit: Payer: Self-pay | Admitting: Neurology

## 2016-11-17 ENCOUNTER — Emergency Department (HOSPITAL_COMMUNITY)
Admission: EM | Admit: 2016-11-17 | Discharge: 2016-11-17 | Disposition: A | Discharge status: Home / Self Care | Payer: Medicare Other | Attending: Emergency Medicine | Admitting: Emergency Medicine

## 2016-11-17 DIAGNOSIS — R091 Pleurisy: Secondary | ICD-10-CM

## 2016-11-17 LAB — I-STAT TROPONIN, ED: Troponin i, poc: 0.01 ng/mL (ref 0.00–0.08)

## 2016-11-17 LAB — D-DIMER, QUANTITATIVE: D-Dimer, Quant: 0.39 ug/mL-FEU (ref 0.00–0.50)

## 2016-11-17 NOTE — ED Provider Notes (Signed)
[facility] EMERGENCY DEPARTMENT Provider Note   CSN: [id number] Arrival date & time: 11/16/16  1735     History   Chief Complaint Chief Complaint - chest pain  HPI Erica Meyers is a 56 y.o. female.  The history is provided by the patient.  Chest Pain   This is a new problem. The problem occurs constantly. The problem has been resolved. The pain is present in the substernal region. The pain is moderate. The pain does not radiate. The symptoms are aggravated by deep breathing. Associated symptoms include shortness of breath. Pertinent negatives include no fever and no vomiting. She has tried rest for the symptoms. The treatment provided mild relief.  Her past medical history is significant for hyperlipidemia and hypertension.  pt reports onset of CP earlier in the night, felt sharp and worse with breathing No fever/vomiting She had mild SOB She has not had this pain recently No h/o CAD/VTE She told nurse it occurred in the shower, she told me it occurred soon after walking up steps She felt like her "breath was catching"  She is now improved  Past Medical History:  Diagnosis Date  . Anemia   . Depression   . Dyslipidemia   . Epilepsy ([place])   . Headache   . Hypertension   . Thyroid disease     Patient Active Problem List   Diagnosis Date Noted  . Therapeutic drug monitoring 10/31/2016  . Neck pain 08/08/2016  . Dyslipidemia   . Depression   . Headache   . Anemia   . Hyperthyroidism 06/12/2016  . Anxiety 10/31/2015  . Migraine without aura and without status migrainosus, not intractable 11/15/2014  . Nonintractable epilepsy without status epilepticus ([place]) 11/15/2014  . Complex partial seizure ([place]) 11/18/2012  . Migraine without aura 11/18/2012  . Unspecified essential hypertension 11/18/2012  . Drowsiness 11/18/2012    Past Surgical History:  Procedure Laterality Date  . CESAREAN SECTION    . TONSILLECTOMY    . TUBAL LIGATION       OB History    No data available       Home Medications    Prior to Admission medications   Medication Sig Start Date End Date Taking? Authorizing Provider  atorvastatin (LIPITOR) 20 MG tablet Take 20 mg by mouth daily.   Yes [provider]  cholecalciferol (VITAMIN D) 1000 UNITS tablet Take 1,000 Units by mouth daily.   Yes [provider]  lamoTRIgine (LAMICTAL) 100 MG tablet Take 2 tablets (200 mg total) by mouth 2 (two) times daily. 10/31/16  Yes Dennie Bible, NP  lisinopril-hydrochlorothiazide (PRINZIDE,ZESTORETIC) 20-25 MG tablet TAKE 1 TABLET BY MOUTH ONCE A DAY (WILL PAY 3/14) 10/29/14  Yes [provider]  meloxicam (MOBIC) 7.5 MG tablet Take 7.5 mg by mouth daily.   Yes [provider]  Multiple Vitamin (MULTIVITAMIN WITH MINERALS) TABS tablet Take 1 tablet by mouth daily.   Yes [provider]  rizatriptan (MAXALT-MLT) 10 MG disintegrating tablet Take 1 tablet (10 mg total) by mouth as needed for migraine. May repeat in 2 hours if needed 10/31/16  Yes Dennie Bible, NP  sertraline (ZOLOFT) 50 MG tablet Take 1 tablet (50 mg total) by mouth daily. Patient taking differently: Take 100 mg by mouth daily.  10/31/16  Yes Dennie Bible, NP  topiramate (TOPAMAX) 100 MG tablet Take 1 tablet (100 mg total) by mouth 2 (two) times daily. 10/31/16  Yes Dennie Bible, NP  triamcinolone cream (KENALOG) 0.1 % Apply 1 application topically 2 (two) times daily. Patient not taking: Reported on 11/17/2016 10/06/16   Janne Napoleon, NP    Family History Family History  Problem Relation Age of Onset  . Hypertension Mother   . Stroke Mother   . Thyroid disease Mother     Social History Social History  Substance Use Topics  . Smoking status: Never Smoker  . Smokeless tobacco: Never Used  . Alcohol use No     Allergies   Penicillins   Review of Systems Review of Systems  Constitutional: Negative for fever.   Respiratory: Positive for shortness of breath.   Cardiovascular: Positive for chest pain.  Gastrointestinal: Negative for vomiting.  All other systems reviewed and are negative.    Physical Exam Updated Vital Signs BP 106/61   Pulse 64   Temp 98.5 F (36.9 C) (Oral)   Resp 17   Ht 1.575 m (5\' 2" )   Wt 80.3 kg (177 lb)   SpO2 95%   BMI 32.37 kg/m   Physical Exam CONSTITUTIONAL: Well developed/well nourished HEAD: Normocephalic/atraumatic EYES: EOMI/PERRL ENMT: Mucous membranes moist NECK: supple no meningeal signs SPINE/BACK:entire spine nontender CV: S1/S2 noted, no murmurs/rubs/gallops noted LUNGS: Lungs are clear to auscultation bilaterally, no apparent distress Chest - no tenderness to palpation ABDOMEN: soft, nontender, no rebound or guarding, bowel sounds noted throughout abdomen GU:no cva tenderness NEURO: Pt is awake/alert/appropriate, moves all extremitiesx4.  No facial droop.   EXTREMITIES: pulses normal/equal, full ROM SKIN: warm, color normal PSYCH: no abnormalities of mood noted, alert and oriented to situation   ED Treatments / Results  Labs (all labs ordered are listed, but only abnormal results are displayed) Labs Reviewed  BASIC METABOLIC PANEL - Abnormal; Notable for the following:       Result Value   Sodium 134 (*)    All other components within normal limits  CBC  D-DIMER, QUANTITATIVE (NOT AT [facility])  I-STAT TROPONIN, ED  I-STAT TROPONIN, ED    EKG  EKG Interpretation  Date/Time:  Saturday November 17 2016 03:06:57 EDT Ventricular Rate:  64 PR Interval:  176 QRS Duration: 86 QT Interval:  414 QTC Calculation: 428 R Axis:   37 Text Interpretation:  Sinus rhythm Borderline prolonged PR interval Low voltage, precordial leads Confirmed by Ripley Fraise [phone]) on 11/17/2016 3:47:34 AM       Radiology Dg Chest 2 View  Result Date: 11/16/2016 CLINICAL DATA:  Chest pressure and shortness of breath this evening, intermittent pain.  History of hypertension. EXAM: CHEST  2 VIEW COMPARISON:  CT chest January 09, 2005 FINDINGS: Cardiomediastinal silhouette is normal. No pleural effusions or focal consolidations. Trachea projects midline and there is no pneumothorax. Soft tissue planes and included osseous structures are non-suspicious. Mild degenerative change of the included cervical spine. IMPRESSION: Negative chest. Electronically Signed   By: Elon Alas M.D.   On: 11/16/2016 19:27    Procedures Procedures (including critical care time)  Medications Ordered in ED Medications - No data to display   Initial Impression / Assessment and Plan / ED Course  I have reviewed the triage vital signs and the nursing notes.  Pertinent labs & imaging results that were available during my care of the patient were reviewed by me and considered in my medical decision making (see chart for details).     HEART score - 3 with two negative troponins D-dimer negative CXR negative EKG unchanged Will d/c home Low suspicion for ACS/Pe/Dissection at  this time  Final Clinical Impressions(s) / ED Diagnoses   Final diagnoses:  Pleurisy    New Prescriptions New Prescriptions   No medications on file     Ripley Fraise, MD 11/17/16 [phone]

## 2016-11-21 ENCOUNTER — Encounter: Payer: Self-pay | Admitting: Endocrinology

## 2016-11-21 ENCOUNTER — Ambulatory Visit (INDEPENDENT_AMBULATORY_CARE_PROVIDER_SITE_OTHER): Payer: Medicare Other | Admitting: Endocrinology

## 2016-11-21 VITALS — BP 130/82 | HR 85 | Wt 179.2 lb

## 2016-11-21 DIAGNOSIS — E0590 Thyrotoxicosis, unspecified without thyrotoxic crisis or storm: Secondary | ICD-10-CM

## 2016-11-21 LAB — T4, FREE: FREE T4: 0.72 ng/dL (ref 0.60–1.60)

## 2016-11-21 LAB — TSH: TSH: 3.69 u[IU]/mL (ref 0.35–4.50)

## 2016-11-21 NOTE — Patient Instructions (Addendum)
blood tests are requested for you today.  We'll let you know about the results.  Please come back for a follow-up appointment in 6 weeks.  

## 2016-11-21 NOTE — Progress Notes (Signed)
Subjective:    Patient ID: Erica Meyers, female    DOB: September 16, 1960, 56 y.o.   MRN: [id number]  HPI Pt returns for f/u of hyperthyroidism (due to multinodular goiter; dx'ed 2002; she had RAI in June, 2018).  pt states weight gain.  Past Medical History:  Diagnosis Date  . Anemia   . Depression   . Dyslipidemia   . Epilepsy ([place])   . Headache   . Hypertension   . Thyroid disease     Past Surgical History:  Procedure Laterality Date  . CESAREAN SECTION    . TONSILLECTOMY    . TUBAL LIGATION      Social History   Social History  . Marital status: Single    Spouse name: N/A  . Number of children: N/A  . Years of education: N/A   Occupational History  . Not on file.   Social History Main Topics  . Smoking status: Never Smoker  . Smokeless tobacco: Never Used  . Alcohol use No  . Drug use: No  . Sexual activity: Not on file   Other Topics Concern  . Not on file   Social History Narrative  . No narrative on file    Current Outpatient Prescriptions on File Prior to Visit  Medication Sig Dispense Refill  . atorvastatin (LIPITOR) 20 MG tablet Take 20 mg by mouth daily.    . cholecalciferol (VITAMIN D) 1000 UNITS tablet Take 1,000 Units by mouth daily.    Marland Kitchen lamoTRIgine (LAMICTAL) 100 MG tablet Take 2 tablets (200 mg total) by mouth 2 (two) times daily. 120 tablet 6  . lisinopril-hydrochlorothiazide (PRINZIDE,ZESTORETIC) 20-25 MG tablet TAKE 1 TABLET BY MOUTH ONCE A DAY (WILL PAY 3/14)  11  . meloxicam (MOBIC) 7.5 MG tablet Take 7.5 mg by mouth daily.    . Multiple Vitamin (MULTIVITAMIN WITH MINERALS) TABS tablet Take 1 tablet by mouth daily.    . rizatriptan (MAXALT-MLT) 10 MG disintegrating tablet Take 1 tablet (10 mg total) by mouth as needed for migraine. May repeat in 2 hours if needed 12 tablet 11  . sertraline (ZOLOFT) 50 MG tablet Take 1 tablet (50 mg total) by mouth daily. (Patient taking differently: Take 100 mg by mouth daily. ) 90 tablet 4  . topiramate  (TOPAMAX) 100 MG tablet Take 1 tablet (100 mg total) by mouth 2 (two) times daily. 180 tablet 4  . triamcinolone cream (KENALOG) 0.1 % Apply 1 application topically 2 (two) times daily. (Patient not taking: Reported on 11/17/2016) 30 g 0   No current facility-administered medications on file prior to visit.     Allergies  Allergen Reactions  . Penicillins     childhood    Family History  Problem Relation Age of Onset  . Hypertension Mother   . Stroke Mother   . Thyroid disease Mother     BP 130/82   Pulse 85   Wt 179 lb 3.2 oz (81.3 kg)   SpO2 96%   BMI 32.78 kg/m   Review of Systems She also has constipation.      Objective:   Physical Exam VITAL SIGNS:  See vs page GENERAL: no distress head: no deformity  eyes: no periorbital swelling, no proptosis.   external nose and ears are normal.  mouth: no lesion seen.   NECK: 2 right sided nodules are again palpable, but are now approx just 1 cm each.       Assessment & Plan:  Hyperthyroidism: due for recheck. Multinodular  goiter: clinically improved since RAI  Patient Instructions  blood tests are requested for you today.  We'll let you know about the results.  Please come back for a follow-up appointment in 6 weeks.

## 2017-01-02 ENCOUNTER — Ambulatory Visit (INDEPENDENT_AMBULATORY_CARE_PROVIDER_SITE_OTHER): Payer: Medicare Other | Admitting: Endocrinology

## 2017-01-02 ENCOUNTER — Encounter: Payer: Self-pay | Admitting: Endocrinology

## 2017-01-02 VITALS — BP 122/74 | HR 85 | Wt 180.4 lb

## 2017-01-02 DIAGNOSIS — E0590 Thyrotoxicosis, unspecified without thyrotoxic crisis or storm: Secondary | ICD-10-CM | POA: Diagnosis not present

## 2017-01-02 LAB — TSH: TSH: 3.41 u[IU]/mL (ref 0.35–4.50)

## 2017-01-02 LAB — T4, FREE: Free T4: 0.67 ng/dL (ref 0.60–1.60)

## 2017-01-02 NOTE — Patient Instructions (Addendum)
blood tests are requested for you today.  We'll let you know about the results.   Please come back for a follow-up appointment in 3 months.   

## 2017-01-02 NOTE — Progress Notes (Signed)
Subjective:    Patient ID: Erica Meyers, female    DOB: December 06, 1960, 56 y.o.   MRN: [id number]  HPI Pt returns for f/u of hyperthyroidism (due to multinodular goiter; dx'ed 2002; she had RAI in June, 2018).  pt states ongoing weight gain.  Past Medical History:  Diagnosis Date  . Anemia   . Depression   . Dyslipidemia   . Epilepsy ([place])   . Headache   . Hypertension   . Thyroid disease     Past Surgical History:  Procedure Laterality Date  . CESAREAN SECTION    . TONSILLECTOMY    . TUBAL LIGATION      Social History   Socioeconomic History  . Marital status: Single    Spouse name: Not on file  . Number of children: Not on file  . Years of education: Not on file  . Highest education level: Not on file  Social Needs  . Financial resource strain: Not on file  . Food insecurity - worry: Not on file  . Food insecurity - inability: Not on file  . Transportation needs - medical: Not on file  . Transportation needs - non-medical: Not on file  Occupational History  . Not on file  Tobacco Use  . Smoking status: Never Smoker  . Smokeless tobacco: Never Used  Substance and Sexual Activity  . Alcohol use: No  . Drug use: No  . Sexual activity: Not on file  Other Topics Concern  . Not on file  Social History Narrative  . Not on file    Current Outpatient Medications on File Prior to Visit  Medication Sig Dispense Refill  . atorvastatin (LIPITOR) 20 MG tablet Take 20 mg by mouth daily.    . cholecalciferol (VITAMIN D) 1000 UNITS tablet Take 1,000 Units by mouth daily.    Marland Kitchen lamoTRIgine (LAMICTAL) 100 MG tablet Take 2 tablets (200 mg total) by mouth 2 (two) times daily. 120 tablet 6  . lisinopril-hydrochlorothiazide (PRINZIDE,ZESTORETIC) 20-25 MG tablet TAKE 1 TABLET BY MOUTH ONCE A DAY (WILL PAY 3/14)  11  . meloxicam (MOBIC) 7.5 MG tablet Take 7.5 mg by mouth daily.    . Multiple Vitamin (MULTIVITAMIN WITH MINERALS) TABS tablet Take 1 tablet by mouth daily.    .  rizatriptan (MAXALT-MLT) 10 MG disintegrating tablet Take 1 tablet (10 mg total) by mouth as needed for migraine. May repeat in 2 hours if needed 12 tablet 11  . sertraline (ZOLOFT) 50 MG tablet Take 1 tablet (50 mg total) by mouth daily. (Patient taking differently: Take 100 mg by mouth daily. ) 90 tablet 4  . topiramate (TOPAMAX) 100 MG tablet Take 1 tablet (100 mg total) by mouth 2 (two) times daily. 180 tablet 4  . triamcinolone cream (KENALOG) 0.1 % Apply 1 application topically 2 (two) times daily. (Patient not taking: Reported on 11/17/2016) 30 g 0   No current facility-administered medications on file prior to visit.     Allergies  Allergen Reactions  . Penicillins     childhood    Family History  Problem Relation Age of Onset  . Hypertension Mother   . Stroke Mother   . Thyroid disease Mother     BP 122/74 (BP Location: Right Arm, Patient Position: Sitting, Cuff Size: Normal)   Pulse 85   Wt 180 lb 6.4 oz (81.8 kg)   SpO2 97%   BMI 33.00 kg/m    Review of Systems Denies neck pain.  Objective:   Physical Exam VITAL SIGNS:  See vs page GENERAL: no distress head: no deformity  eyes: no periorbital swelling, no proptosis.   external nose and ears are normal.  mouth: no lesion seen.   NECK: 2 right sided nodules are again palpable, but are now approx just 1 cm each.        Assessment & Plan:  Hyperthyroidism, due to recheck  Patient Instructions  blood tests are requested for you today.  We'll let you know about the results.  Please come back for a follow-up appointment in 3 months.

## 2017-03-12 ENCOUNTER — Other Ambulatory Visit: Payer: Self-pay | Admitting: Family Medicine

## 2017-03-12 DIAGNOSIS — Z1231 Encounter for screening mammogram for malignant neoplasm of breast: Secondary | ICD-10-CM

## 2017-04-04 ENCOUNTER — Encounter: Payer: Self-pay | Admitting: Endocrinology

## 2017-04-04 ENCOUNTER — Ambulatory Visit (INDEPENDENT_AMBULATORY_CARE_PROVIDER_SITE_OTHER): Payer: Medicare Other | Admitting: Endocrinology

## 2017-04-04 ENCOUNTER — Ambulatory Visit
Admission: RE | Admit: 2017-04-04 | Discharge: 2017-04-04 | Disposition: A | Discharge status: Home / Self Care | Payer: Medicare Other | Source: Ambulatory Visit | Attending: Family Medicine | Admitting: Family Medicine

## 2017-04-04 VITALS — BP 118/76 | HR 84 | Wt 184.6 lb

## 2017-04-04 DIAGNOSIS — E0590 Thyrotoxicosis, unspecified without thyrotoxic crisis or storm: Secondary | ICD-10-CM

## 2017-04-04 DIAGNOSIS — Z1231 Encounter for screening mammogram for malignant neoplasm of breast: Secondary | ICD-10-CM

## 2017-04-04 LAB — TSH: TSH: 3.17 u[IU]/mL (ref 0.35–4.50)

## 2017-04-04 LAB — T4, FREE: Free T4: 0.66 ng/dL (ref 0.60–1.60)

## 2017-04-04 NOTE — Patient Instructions (Signed)
blood tests are requested for you today.  We'll let you know about the results.   Please come back for a follow-up appointment in 3 months.   

## 2017-04-04 NOTE — Progress Notes (Signed)
Subjective:    Patient ID: Erica Meyers, female    DOB: 09-Apr-1960, 57 y.o.   MRN: [id number]  HPI Pt returns for f/u of hyperthyroidism (due to multinodular goiter; dx'ed 2002; she had RAI in June, 2018).  pt reports weight gain.   Past Medical History:  Diagnosis Date  . Anemia   . Depression   . Dyslipidemia   . Epilepsy ([place])   . Headache   . Hypertension   . Thyroid disease     Past Surgical History:  Procedure Laterality Date  . CESAREAN SECTION    . TONSILLECTOMY    . TUBAL LIGATION      Social History   Socioeconomic History  . Marital status: Single    Spouse name: Not on file  . Number of children: Not on file  . Years of education: Not on file  . Highest education level: Not on file  Social Needs  . Financial resource strain: Not on file  . Food insecurity - worry: Not on file  . Food insecurity - inability: Not on file  . Transportation needs - medical: Not on file  . Transportation needs - non-medical: Not on file  Occupational History  . Not on file  Tobacco Use  . Smoking status: Never Smoker  . Smokeless tobacco: Never Used  Substance and Sexual Activity  . Alcohol use: No  . Drug use: No  . Sexual activity: Not on file  Other Topics Concern  . Not on file  Social History Narrative  . Not on file    Current Outpatient Medications on File Prior to Visit  Medication Sig Dispense Refill  . atorvastatin (LIPITOR) 20 MG tablet Take 20 mg by mouth daily.    . cholecalciferol (VITAMIN D) 1000 UNITS tablet Take 1,000 Units by mouth daily.    Marland Kitchen lamoTRIgine (LAMICTAL) 100 MG tablet Take 2 tablets (200 mg total) by mouth 2 (two) times daily. 120 tablet 6  . lisinopril-hydrochlorothiazide (PRINZIDE,ZESTORETIC) 20-25 MG tablet TAKE 1 TABLET BY MOUTH ONCE A DAY (WILL PAY 3/14)  11  . meloxicam (MOBIC) 7.5 MG tablet Take 7.5 mg by mouth daily.    . Multiple Vitamin (MULTIVITAMIN WITH MINERALS) TABS tablet Take 1 tablet by mouth daily.    .  rizatriptan (MAXALT-MLT) 10 MG disintegrating tablet Take 1 tablet (10 mg total) by mouth as needed for migraine. May repeat in 2 hours if needed 12 tablet 11  . sertraline (ZOLOFT) 50 MG tablet Take 1 tablet (50 mg total) by mouth daily. (Patient taking differently: Take 100 mg by mouth daily. ) 90 tablet 4  . topiramate (TOPAMAX) 100 MG tablet Take 1 tablet (100 mg total) by mouth 2 (two) times daily. 180 tablet 4  . triamcinolone cream (KENALOG) 0.1 % Apply 1 application topically 2 (two) times daily. 30 g 0   No current facility-administered medications on file prior to visit.     Allergies  Allergen Reactions  . Penicillins     childhood    Family History  Problem Relation Age of Onset  . Hypertension Mother   . Stroke Mother   . Thyroid disease Mother     BP 118/76 (BP Location: Left Arm, Patient Position: Sitting, Cuff Size: Normal)   Pulse 84   Wt 184 lb 9.6 oz (83.7 kg)   SpO2 94%   BMI 33.76 kg/m    Review of Systems She has fatigue    Objective:   Physical Exam VITAL SIGNS:  See vs page GENERAL: no distress NECK: thyroid is slightly enlarged, with irreg surface, but no discrete nodule is palpable.        Assessment & Plan:  Hyperthyroidism: due for recheck.  Patient Instructions  blood tests are requested for you today.  We'll let you know about the results.  Please come back for a follow-up appointment in 3 months.
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
## 2017-05-01 ENCOUNTER — Ambulatory Visit (INDEPENDENT_AMBULATORY_CARE_PROVIDER_SITE_OTHER): Payer: Medicare Other | Admitting: Nurse Practitioner

## 2017-05-01 ENCOUNTER — Encounter: Payer: Self-pay | Admitting: Nurse Practitioner

## 2017-05-01 VITALS — BP 125/82 | HR 86 | Ht 62.0 in | Wt 186.8 lb

## 2017-05-01 DIAGNOSIS — G43009 Migraine without aura, not intractable, without status migrainosus: Secondary | ICD-10-CM

## 2017-05-01 DIAGNOSIS — G40209 Localization-related (focal) (partial) symptomatic epilepsy and epileptic syndromes with complex partial seizures, not intractable, without status epilepticus: Secondary | ICD-10-CM

## 2017-05-01 DIAGNOSIS — Z5181 Encounter for therapeutic drug level monitoring: Secondary | ICD-10-CM | POA: Diagnosis not present

## 2017-05-01 NOTE — Patient Instructions (Signed)
Will check a lamotrigine level for therapeutic level Continue Lamictal 200 mg in the morning 200 every night for now willl refill when labs back Continue Topamax 100 twice daily Continue Maxalt as needed acutely for headache/migraine Continue Zoloft 50 every morning Follow-up in 6 months Call for increase in headaches or staring spells

## 2017-05-02 ENCOUNTER — Telehealth: Payer: Self-pay | Admitting: Nurse Practitioner

## 2017-05-02 LAB — LAMOTRIGINE LEVEL: LAMOTRIGINE LVL: 3.1 ug/mL (ref 2.0–20.0)

## 2017-05-02 MED ORDER — LAMOTRIGINE 150 MG PO TABS: ORAL_TABLET | ORAL | 6 refills | Status: DC

## 2017-05-02 NOTE — Telephone Encounter (Signed)
Called pt and relayed that her lab result for her lamictal level was low therapeutic range.  Since having seizures will change dose to lamictal 300mg  po bid.  (prescription already to [facility].  (they are 150mg  tablets--take 2 tabs po bid).  Pt verbalized understanding.  Pt stated she was doing ok today.  She thanked [place] for calling.

## 2017-05-02 NOTE — Telephone Encounter (Signed)
Lamictal level low therapeutic.  Since she is continuing to have seizure activity will change dose to 300mg  twice daily. This will be a 150mg  tab take 2 twice daily. Rx to pharmacy please call the pt.

## 2017-05-09 ENCOUNTER — Other Ambulatory Visit: Payer: Self-pay | Admitting: Family Medicine

## 2017-05-09 ENCOUNTER — Other Ambulatory Visit (HOSPITAL_COMMUNITY)
Admission: RE | Admit: 2017-05-09 | Discharge: 2017-05-09 | Disposition: A | Discharge status: Home / Self Care | Payer: Medicare Other | Source: Ambulatory Visit | Attending: Family Medicine | Admitting: Family Medicine

## 2017-05-09 DIAGNOSIS — Z01411 Encounter for gynecological examination (general) (routine) with abnormal findings: Secondary | ICD-10-CM | POA: Diagnosis present

## 2017-05-13 LAB — CYTOLOGY - PAP
DIAGNOSIS: NEGATIVE
HPV (WINDOPATH): NOT DETECTED

## 2017-07-05 ENCOUNTER — Encounter: Payer: Self-pay | Admitting: Endocrinology

## 2017-07-05 ENCOUNTER — Ambulatory Visit (INDEPENDENT_AMBULATORY_CARE_PROVIDER_SITE_OTHER): Payer: Medicare Other | Admitting: Endocrinology

## 2017-07-05 DIAGNOSIS — E042 Nontoxic multinodular goiter: Secondary | ICD-10-CM | POA: Insufficient documentation

## 2017-07-05 LAB — TSH: TSH: 4.96 u[IU]/mL — ABNORMAL HIGH (ref 0.35–4.50)

## 2017-07-05 MED ORDER — LEVOTHYROXINE SODIUM 25 MCG PO TABS: 25.0000 ug | ORAL_TABLET | Freq: Every day | ORAL | 3 refills | Status: DC

## 2017-07-05 NOTE — Patient Instructions (Addendum)
blood tests are requested for you today.  We'll let you know about the results.  Let's recheck the ultrasound.  you will receive a phone call, about a day and time for an appointment.  Please come back for a follow-up appointment in 6 months.     

## 2017-07-05 NOTE — Progress Notes (Signed)
Subjective:    Patient ID: Erica Meyers, female    DOB: [birth year], 57 y.o.   MRN: [id number]  HPI Pt returns for f/u of hyperthyroidism (due to multinodular goiter; dx'ed 2002; she had RAI in June, 2018; since then, she has been euthyroid off any rx).  pt reports weight gain.   Past Medical History:  Diagnosis Date  . Anemia   . Depression   . Dyslipidemia   . Epilepsy ([place])   . Headache   . Hypertension   . Thyroid disease     Past Surgical History:  Procedure Laterality Date  . CESAREAN SECTION    . TONSILLECTOMY    . TUBAL LIGATION      Social History   Socioeconomic History  . Marital status: Single    Spouse name: Not on file  . Number of children: Not on file  . Years of education: Not on file  . Highest education level: Not on file  Occupational History  . Not on file  Social Needs  . Financial resource strain: Not on file  . Food insecurity:    Worry: Not on file    Inability: Not on file  . Transportation needs:    Medical: Not on file    Non-medical: Not on file  Tobacco Use  . Smoking status: Never Smoker  . Smokeless tobacco: Never Used  Substance and Sexual Activity  . Alcohol use: No  . Drug use: No  . Sexual activity: Not on file  Lifestyle  . Physical activity:    Days per week: Not on file    Minutes per session: Not on file  . Stress: Not on file  Relationships  . Social connections:    Talks on phone: Not on file    Gets together: Not on file    Attends religious service: Not on file    Active member of club or organization: Not on file    Attends meetings of clubs or organizations: Not on file    Relationship status: Not on file  . Intimate partner violence:    Fear of current or ex partner: Not on file    Emotionally abused: Not on file    Physically abused: Not on file    Forced sexual activity: Not on file  Other Topics Concern  . Not on file  Social History Narrative  . Not on file    Current Outpatient  Medications on File Prior to Visit  Medication Sig Dispense Refill  . atorvastatin (LIPITOR) 20 MG tablet Take 20 mg by mouth daily.    . cholecalciferol (VITAMIN D) 1000 UNITS tablet Take 1,000 Units by mouth daily.    Marland Kitchen lamoTRIgine (LAMICTAL) 150 MG tablet 2 tabs twice daily or 300mg  twice daily 120 tablet 6  . lisinopril-hydrochlorothiazide (PRINZIDE,ZESTORETIC) 20-25 MG tablet TAKE 1 TABLET BY MOUTH ONCE A DAY (WILL PAY 3/14)  11  . meloxicam (MOBIC) 7.5 MG tablet Take 7.5 mg by mouth daily.    . Multiple Vitamin (MULTIVITAMIN WITH MINERALS) TABS tablet Take 1 tablet by mouth daily.    . rizatriptan (MAXALT-MLT) 10 MG disintegrating tablet Take 1 tablet (10 mg total) by mouth as needed for migraine. May repeat in 2 hours if needed 12 tablet 11  . sertraline (ZOLOFT) 50 MG tablet Take 1 tablet (50 mg total) by mouth daily. (Patient taking differently: Take 100 mg by mouth daily. ) 90 tablet 4  . topiramate (TOPAMAX) 100 MG tablet Take 1 tablet (  100 mg total) by mouth 2 (two) times daily. 180 tablet 4  . triamcinolone cream (KENALOG) 0.1 % Apply 1 application topically 2 (two) times daily. 30 g 0   No current facility-administered medications on file prior to visit.     Allergies  Allergen Reactions  . Penicillins     childhood    Family History  Problem Relation Age of Onset  . Hypertension Mother   . Stroke Mother   . Thyroid disease Mother     BP 128/72   Pulse 74   Wt 183 lb 6.4 oz (83.2 kg)   SpO2 98%   BMI 33.54 kg/m    Review of Systems No fever    Objective:   Physical Exam VITAL SIGNS:  See vs page GENERAL: no distress NECK: thyroid is slightly enlarged, with irreg surface, but no discrete nodule is palpable.        Assessment & Plan:  Hyperthyroidism: recheck today Multinodular goiter: recheck today  Patient Instructions  blood tests are requested for you today.  We'll let you know about the results.  Let's recheck the ultrasound.  you will receive a  phone call, about a day and time for an appointment. Please come back for a follow-up appointment in 6 months.

## 2017-07-19 ENCOUNTER — Ambulatory Visit
Admission: RE | Admit: 2017-07-19 | Discharge: 2017-07-19 | Disposition: A | Discharge status: Home / Self Care | Payer: Medicare Other | Source: Ambulatory Visit | Attending: Endocrinology | Admitting: Endocrinology

## 2017-07-19 DIAGNOSIS — E042 Nontoxic multinodular goiter: Secondary | ICD-10-CM

## 2017-07-22 ENCOUNTER — Telehealth: Payer: Self-pay | Admitting: Endocrinology

## 2017-07-22 DIAGNOSIS — E042 Nontoxic multinodular goiter: Secondary | ICD-10-CM

## 2017-07-22 NOTE — Telephone Encounter (Signed)
It is borderline as to whether we should do the biopsy or recheck the ultrasound in the future.  i'll go with whichever you say.  Does this help?

## 2017-07-22 NOTE — Telephone Encounter (Signed)
Patient really wanted to speak to you, but I asked that she send my chart message as well. She has a lot of questions & wants to know if she should just come in for an appointment?

## 2017-07-22 NOTE — Telephone Encounter (Signed)
Patient would like her lab results

## 2017-07-24 NOTE — Telephone Encounter (Signed)
Ok, I requested bx

## 2017-07-24 NOTE — Telephone Encounter (Signed)
I notified patient that biopsy was ordered.

## 2017-07-24 NOTE — Addendum Note (Signed)
Addended by: Renato Shin on: 07/24/2017 09:17 AM   Modules accepted: Orders

## 2017-07-24 NOTE — Telephone Encounter (Signed)
I called patient & she does not want to wait to recheck. She wants to go ahead & pursue biopsy now with all the symptoms she has going on.

## 2017-08-16 ENCOUNTER — Ambulatory Visit (INDEPENDENT_AMBULATORY_CARE_PROVIDER_SITE_OTHER): Payer: Medicare Other | Admitting: Endocrinology

## 2017-08-16 VITALS — BP 138/82 | HR 84 | Wt 184.6 lb

## 2017-08-16 DIAGNOSIS — E042 Nontoxic multinodular goiter: Secondary | ICD-10-CM

## 2017-08-16 LAB — T4, FREE: FREE T4: 0.82 ng/dL (ref 0.60–1.60)

## 2017-08-16 LAB — TSH: TSH: 4.66 u[IU]/mL — ABNORMAL HIGH (ref 0.35–4.50)

## 2017-08-16 MED ORDER — LEVOTHYROXINE SODIUM 50 MCG PO TABS: 50.0000 ug | ORAL_TABLET | Freq: Every day | ORAL | 3 refills | Status: DC

## 2017-08-16 NOTE — Progress Notes (Signed)
Subjective:    Patient ID: Erica Meyers, female    DOB: 09/10/1960, 57 y.o.   MRN: [id number]  HPI Pt returns for f/u of hyperthyroidism (due to multinodular goiter; dx'ed 2002; she had RAI in June, 2018; she started synthroid in June, 2019).  Pt reports ongoing weight gain Past Medical History:  Diagnosis Date  . Anemia   . Depression   . Dyslipidemia   . Epilepsy ([place])   . Headache   . Hypertension   . Thyroid disease     Past Surgical History:  Procedure Laterality Date  . CESAREAN SECTION    . TONSILLECTOMY    . TUBAL LIGATION      Social History   Socioeconomic History  . Marital status: Single    Spouse name: Not on file  . Number of children: Not on file  . Years of education: Not on file  . Highest education level: Not on file  Occupational History  . Not on file  Social Needs  . Financial resource strain: Not on file  . Food insecurity:    Worry: Not on file    Inability: Not on file  . Transportation needs:    Medical: Not on file    Non-medical: Not on file  Tobacco Use  . Smoking status: Never Smoker  . Smokeless tobacco: Never Used  Substance and Sexual Activity  . Alcohol use: No  . Drug use: No  . Sexual activity: Not on file  Lifestyle  . Physical activity:    Days per week: Not on file    Minutes per session: Not on file  . Stress: Not on file  Relationships  . Social connections:    Talks on phone: Not on file    Gets together: Not on file    Attends religious service: Not on file    Active member of club or organization: Not on file    Attends meetings of clubs or organizations: Not on file    Relationship status: Not on file  . Intimate partner violence:    Fear of current or ex partner: Not on file    Emotionally abused: Not on file    Physically abused: Not on file    Forced sexual activity: Not on file  Other Topics Concern  . Not on file  Social History Narrative  . Not on file    Current Outpatient Medications on  File Prior to Visit  Medication Sig Dispense Refill  . atorvastatin (LIPITOR) 20 MG tablet Take 20 mg by mouth daily.    . cholecalciferol (VITAMIN D) 1000 UNITS tablet Take 1,000 Units by mouth daily.    Marland Kitchen lamoTRIgine (LAMICTAL) 150 MG tablet 2 tabs twice daily or 300mg  twice daily 120 tablet 6  . lisinopril-hydrochlorothiazide (PRINZIDE,ZESTORETIC) 20-25 MG tablet TAKE 1 TABLET BY MOUTH ONCE A DAY (WILL PAY 3/14)  11  . meloxicam (MOBIC) 7.5 MG tablet Take 7.5 mg by mouth daily.    . Multiple Vitamin (MULTIVITAMIN WITH MINERALS) TABS tablet Take 1 tablet by mouth daily.    . rizatriptan (MAXALT-MLT) 10 MG disintegrating tablet Take 1 tablet (10 mg total) by mouth as needed for migraine. May repeat in 2 hours if needed 12 tablet 11  . sertraline (ZOLOFT) 50 MG tablet Take 1 tablet (50 mg total) by mouth daily. (Patient taking differently: Take 100 mg by mouth daily. ) 90 tablet 4  . topiramate (TOPAMAX) 100 MG tablet Take 1 tablet (100 mg total) by  mouth 2 (two) times daily. 180 tablet 4  . triamcinolone cream (KENALOG) 0.1 % Apply 1 application topically 2 (two) times daily. 30 g 0   No current facility-administered medications on file prior to visit.     Allergies  Allergen Reactions  . Penicillins     childhood    Family History  Problem Relation Age of Onset  . Hypertension Mother   . Stroke Mother   . Thyroid disease Mother     BP 138/82 (BP Location: Left Arm, Patient Position: Sitting, Cuff Size: Normal)   Pulse 84   Wt 184 lb 9.6 oz (83.7 kg)   SpO2 96%   BMI 33.76 kg/m    Review of Systems Denies neck swelling    Objective:   Physical Exam VITAL SIGNS:  See vs page GENERAL: no distress NECK: thyroid is slightly enlarged, with irreg surface, but no discrete nodule is palpable.   Slight interval growth of nodule #4 within the left lobe of the thyroid, which now meets imaging criteria to recommend percutaneous sampling as clinically indicated. 2. Despite  reduction in size (presumably attributable to provided history of radioactive iodine ablative therapy), nodule #1 within the right lobe of the thyroid continues to meet imaging criteria to recommend percutaneous sampling as clinically indicated.    Assessment & Plan:  Multinodular goiter: due for recheck Post-RAI hypothyroidism: recheck today  Patient Instructions  blood tests are requested for you today.  We'll let you know about the results.  Let's recheck the ultrasound.  you will receive a phone call, about a day and time for an appointment.  Please come back for a follow-up appointment in 6 months.

## 2017-08-16 NOTE — Patient Instructions (Addendum)
blood tests are requested for you today.  We'll let you know about the results.  Let's recheck the ultrasound.  you will receive a phone call, about a day and time for an appointment.  Please come back for a follow-up appointment in 6 months.

## 2017-08-28 ENCOUNTER — Telehealth: Payer: Self-pay | Admitting: Endocrinology

## 2017-08-28 ENCOUNTER — Other Ambulatory Visit: Payer: Medicare Other

## 2017-08-28 DIAGNOSIS — E0590 Thyrotoxicosis, unspecified without thyrotoxic crisis or storm: Secondary | ICD-10-CM

## 2017-08-28 DIAGNOSIS — E039 Hypothyroidism, unspecified: Secondary | ICD-10-CM | POA: Insufficient documentation

## 2017-08-28 DIAGNOSIS — E890 Postprocedural hypothyroidism: Secondary | ICD-10-CM

## 2017-08-28 NOTE — Telephone Encounter (Signed)
Please advise 

## 2017-08-28 NOTE — Telephone Encounter (Signed)
I have called & patient is coming in today for labs.

## 2017-08-28 NOTE — Telephone Encounter (Signed)
Please come in to have labs rechecked.  I have ordered

## 2017-08-28 NOTE — Telephone Encounter (Signed)
Patient stated she is having a lot of swelling all over her body and aches in her joints She stated her medication was increased at her last visit.  She would like to know what she should do. Please advise     levothyroxine (SYNTHROID, LEVOTHROID) 50 MCG tablet

## 2017-08-29 ENCOUNTER — Other Ambulatory Visit: Payer: Self-pay | Admitting: Endocrinology

## 2017-08-29 ENCOUNTER — Other Ambulatory Visit (INDEPENDENT_AMBULATORY_CARE_PROVIDER_SITE_OTHER): Payer: Medicare Other

## 2017-08-29 DIAGNOSIS — E890 Postprocedural hypothyroidism: Secondary | ICD-10-CM | POA: Diagnosis not present

## 2017-08-29 LAB — TSH: TSH: 5.21 u[IU]/mL — AB (ref 0.35–4.50)

## 2017-08-29 LAB — T4, FREE: Free T4: 0.96 ng/dL (ref 0.60–1.60)

## 2017-08-29 MED ORDER — LEVOTHYROXINE SODIUM 75 MCG PO TABS: 75.0000 ug | ORAL_TABLET | Freq: Every day | ORAL | 3 refills | Status: DC

## 2017-09-03 ENCOUNTER — Other Ambulatory Visit (HOSPITAL_COMMUNITY)
Admission: RE | Admit: 2017-09-03 | Discharge: 2017-09-03 | Disposition: A | Discharge status: Home / Self Care | Payer: Medicare Other | Source: Ambulatory Visit | Attending: Radiology | Admitting: Radiology

## 2017-09-03 ENCOUNTER — Ambulatory Visit
Admission: RE | Admit: 2017-09-03 | Discharge: 2017-09-03 | Disposition: A | Discharge status: Home / Self Care | Payer: Medicare Other | Source: Ambulatory Visit | Attending: Endocrinology | Admitting: Endocrinology

## 2017-09-03 DIAGNOSIS — E042 Nontoxic multinodular goiter: Secondary | ICD-10-CM
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
## 2017-10-31 ENCOUNTER — Ambulatory Visit (INDEPENDENT_AMBULATORY_CARE_PROVIDER_SITE_OTHER): Payer: Medicare Other | Admitting: Nurse Practitioner

## 2017-10-31 ENCOUNTER — Encounter: Payer: Self-pay | Admitting: Nurse Practitioner

## 2017-10-31 VITALS — BP 119/74 | HR 75 | Ht 62.0 in | Wt 179.6 lb

## 2017-10-31 DIAGNOSIS — R400 Somnolence: Secondary | ICD-10-CM

## 2017-10-31 DIAGNOSIS — R0683 Snoring: Secondary | ICD-10-CM | POA: Diagnosis not present

## 2017-10-31 DIAGNOSIS — Z5181 Encounter for therapeutic drug level monitoring: Secondary | ICD-10-CM

## 2017-10-31 DIAGNOSIS — G43009 Migraine without aura, not intractable, without status migrainosus: Secondary | ICD-10-CM | POA: Diagnosis not present

## 2017-10-31 DIAGNOSIS — G40209 Localization-related (focal) (partial) symptomatic epilepsy and epileptic syndromes with complex partial seizures, not intractable, without status epilepticus: Secondary | ICD-10-CM

## 2017-10-31 DIAGNOSIS — G40909 Epilepsy, unspecified, not intractable, without status epilepticus: Secondary | ICD-10-CM | POA: Diagnosis not present

## 2017-10-31 MED ORDER — RIZATRIPTAN BENZOATE 10 MG PO TBDP: 10.0000 mg | ORAL_TABLET | ORAL | 11 refills | Status: DC | PRN

## 2017-10-31 MED ORDER — TOPIRAMATE 100 MG PO TABS: 100.0000 mg | ORAL_TABLET | Freq: Two times a day (BID) | ORAL | 3 refills | Status: DC

## 2017-10-31 NOTE — Patient Instructions (Addendum)
Will check a lamotrigine level for therapeutic level trough pt already took morning dose with in the next week before morning dose Continue Lamictal 300 mg in the morning 300 every night for now. Continue Topamax 100 twice daily Continue Maxalt as needed acutely for headache/migraine Continue Zoloft 50 every morning Will get sleep study She will follow up after sleep study I explained in particular the risks and ramifications of untreated moderate to severe OSA, especially with respect to cardiovascular disease  including congestive heart failure, difficult to treat hypertension, cardiac arrhythmias, or stroke. Even type 2 diabetes has, in part, been linked to untreated OSA. Symptoms of untreated OSA include daytime sleepiness, memory problems, mood irritability and mood disorder such as depression and anxiety, lack of energy, as well as recurrent headaches, especially morning headaches. We talked about trying to maintain a healthy lifestyle in general, as well as the importance of weight control. I encouraged the patient to eat healthy, exercise daily and keep well hydrated, to keep a scheduled bedtime and wake time routine, to not skip any meals and eat healthy snacks in between meals

## 2017-11-22 ENCOUNTER — Other Ambulatory Visit: Payer: Self-pay | Admitting: Nurse Practitioner

## 2017-12-09 ENCOUNTER — Other Ambulatory Visit (INDEPENDENT_AMBULATORY_CARE_PROVIDER_SITE_OTHER): Payer: Self-pay

## 2017-12-09 ENCOUNTER — Encounter: Payer: Self-pay | Admitting: Neurology

## 2017-12-09 ENCOUNTER — Ambulatory Visit (INDEPENDENT_AMBULATORY_CARE_PROVIDER_SITE_OTHER): Payer: Medicare Other | Admitting: Neurology

## 2017-12-09 ENCOUNTER — Telehealth: Payer: Self-pay | Admitting: Endocrinology

## 2017-12-09 VITALS — BP 103/59 | HR 69 | Ht 62.5 in | Wt 180.0 lb

## 2017-12-09 DIAGNOSIS — R351 Nocturia: Secondary | ICD-10-CM

## 2017-12-09 DIAGNOSIS — G40909 Epilepsy, unspecified, not intractable, without status epilepticus: Secondary | ICD-10-CM

## 2017-12-09 DIAGNOSIS — E669 Obesity, unspecified: Secondary | ICD-10-CM

## 2017-12-09 DIAGNOSIS — R51 Headache: Secondary | ICD-10-CM

## 2017-12-09 DIAGNOSIS — R0683 Snoring: Secondary | ICD-10-CM | POA: Diagnosis not present

## 2017-12-09 DIAGNOSIS — G4719 Other hypersomnia: Secondary | ICD-10-CM

## 2017-12-09 DIAGNOSIS — Z5181 Encounter for therapeutic drug level monitoring: Secondary | ICD-10-CM

## 2017-12-09 DIAGNOSIS — Z820 Family history of epilepsy and other diseases of the nervous system: Secondary | ICD-10-CM

## 2017-12-09 DIAGNOSIS — G40209 Localization-related (focal) (partial) symptomatic epilepsy and epileptic syndromes with complex partial seizures, not intractable, without status epilepticus: Secondary | ICD-10-CM

## 2017-12-09 DIAGNOSIS — R519 Headache, unspecified: Secondary | ICD-10-CM

## 2017-12-09 DIAGNOSIS — Z0289 Encounter for other administrative examinations: Secondary | ICD-10-CM

## 2017-12-09 NOTE — Patient Instructions (Signed)

## 2017-12-09 NOTE — Telephone Encounter (Signed)
Returned pt call. At her request, appt has been rescheduled to tomorrow at 10:30. Advised to stay hydrated and to elevate feet when able. Will evaluate concerns. If symptoms worsen, advised to proceed to ED.

## 2017-12-09 NOTE — Progress Notes (Signed)
Subjective:    Patient ID: Erica Meyers is a 57 y.o. female.  HPI     Erica Age, MD, PhD [facility] [address], [address] P.O. [place],  [address]  Dear Erica Meyers,  I saw your patient, Erica Meyers, upon your kind request in my sleep clinic today for initial consultation of her sleep disorder, in particular evaluation for underlying obstructive sleep apnea. The patient is accompanied by her friend, Erica Meyers, today. As you know, Erica Meyers is a 57 year old right-handed woman with an underlying medical history of migraines, epilepsy, hypertension, hyperlipidemia, thyroid disease, depression, anemia and obesity, who reports snoring and excessive daytime somnolence. I reviewed your office note from 10/31/2017. Of note, she had a baseline sleep study on 12/10/2012 which showed no significant sleep disordered breathing. AHI was 2.8 per hour, O2 nadir was 83%, she had no significant PLMS. She has gained some weight in the interim, in the realm of 7 pounds she is single and lives with her daughter. She has 2 children. She is a nonsmoker and does not utilize alcohol or drinks caffeine on a regular basis. Her Epworth sleepiness score is 11 out of 24 today, fatigue score is 63 out of 63. She has a long-standing history of difficulty falling asleep and staying asleep. She has tried over-the-counter melatonin and some years ago, probably 10 or 12 years ago she tried Ambien but had side effects including sleepwalking and sleep related eating. She lives with her daughter and daughter's 3 young children, ages 77, 44, and 58. Her 29-year-old grandson often wants to sleep in her bedroom. She has a TV on at night but tries to turn it off before falling asleep. Sometimes she does not fall asleep until 4 AM. Bedtime is around 11 PM. Rise time around 9 AM. She has nocturia about 2-3 times per average night and has had morning headaches. Her sister has sleep apnea and uses a CPAP  machine.  Her Past Medical History Is Significant For: Past Medical History:  Diagnosis Date  . Anemia   . Depression   . Dyslipidemia   . Epilepsy ([place])   . Headache   . Hypertension   . Thyroid disease     Her Past Surgical History Is Significant For: Past Surgical History:  Procedure Laterality Date  . CESAREAN SECTION    . TONSILLECTOMY    . TUBAL LIGATION      Her Family History Is Significant For: Family History  Problem Relation Meyers of Onset  . Hypertension Mother   . Stroke Mother   . Thyroid disease Mother     Her Social History Is Significant For: Social History   Socioeconomic History  . Marital status: Single    Spouse name: Not on file  . Number of children: Not on file  . Years of education: Not on file  . Highest education level: Not on file  Occupational History  . Not on file  Social Needs  . Financial resource strain: Not on file  . Food insecurity:    Worry: Not on file    Inability: Not on file  . Transportation needs:    Medical: Not on file    Non-medical: Not on file  Tobacco Use  . Smoking status: Never Smoker  . Smokeless tobacco: Never Used  Substance and Sexual Activity  . Alcohol use: No  . Drug use: No  . Sexual activity: Not on file  Lifestyle  . Physical activity:  Days per week: Not on file    Minutes per session: Not on file  . Stress: Not on file  Relationships  . Social connections:    Talks on phone: Not on file    Gets together: Not on file    Attends religious service: Not on file    Active member of club or organization: Not on file    Attends meetings of clubs or organizations: Not on file    Relationship status: Not on file  Other Topics Concern  . Not on file  Social History Narrative  . Not on file    Her Allergies Are:  Allergies  Allergen Reactions  . Penicillins     Childhood, does not recall reaction.  :   Her Current Medications Are:  Outpatient Encounter Medications as of 12/09/2017   Medication Sig  . atorvastatin (LIPITOR) 20 MG tablet Take 20 mg by mouth daily.  . cholecalciferol (VITAMIN D) 1000 UNITS tablet Take 1,000 Units by mouth daily.  Marland Kitchen lamoTRIgine (LAMICTAL) 150 MG tablet TAKE 2 TABLETS BY MOUTH TWICE A DAY  . levothyroxine (SYNTHROID, LEVOTHROID) 75 MCG tablet Take 1 tablet (75 mcg total) by mouth daily before breakfast.  . lisinopril-hydrochlorothiazide (PRINZIDE,ZESTORETIC) 20-25 MG tablet TAKE 1 TABLET BY MOUTH ONCE A DAY (WILL PAY 3/14)  . meloxicam (MOBIC) 7.5 MG tablet Take 7.5 mg by mouth daily.  . Multiple Vitamin (MULTIVITAMIN WITH MINERALS) TABS tablet Take 1 tablet by mouth daily.  . rizatriptan (MAXALT-MLT) 10 MG disintegrating tablet Take 1 tablet (10 mg total) by mouth as needed for migraine. May repeat in 2 hours if needed  . sertraline (ZOLOFT) 100 MG tablet Take 100 mg by mouth daily.  Marland Kitchen topiramate (TOPAMAX) 100 MG tablet Take 1 tablet (100 mg total) by mouth 2 (two) times daily.  Marland Kitchen triamcinolone cream (KENALOG) 0.1 % Apply 1 application topically 2 (two) times daily.   No facility-administered encounter medications on file as of 12/09/2017.   :  Review of Systems:  Out of a complete 14 point review of systems, all are reviewed and negative with the exception of these symptoms as listed below:  Review of Systems  Neurological:       Pt presents today to discuss her sleep. Pt has had a sleep study in 2014. Pt does endorse snoring.  Epworth Sleepiness Scale 0= would never doze 1= slight chance of dozing 2= moderate chance of dozing 3= high chance of dozing  Sitting and reading: 2 Watching TV: 2 Sitting inactive in a public place (ex. Theater or meeting): 2 As a passenger in a car for an hour without a break: 2 Lying down to rest in the afternoon: 2 Sitting and talking to someone: 0 Sitting quietly after lunch (no alcohol): 1 In a car, while stopped in traffic: 0 Total: 11     Objective:  Neurological Exam  Physical  Exam Physical Examination:   Vitals:   12/09/17 1330  BP: (!) 103/59  Pulse: 69    General Examination: The patient is a very pleasant 57 y.o. female in no acute distress. She appears well-developed and well-nourished and well groomed.   HEENT: Normocephalic, atraumatic, pupils are equal, round and reactive to light and accommodation. Extraocular tracking is good without limitation to gaze excursion or nystagmus noted. Normal smooth pursuit is noted. Hearing is grossly intact. Face is symmetric with normal facial animation and normal facial sensation. Speech is clear with no dysarthria noted. There is no hypophonia. There is no  lip, neck/head, jaw or voice tremor. Neck is supple with full range of passive and active motion. There are no carotid bruits on auscultation. Oropharynx exam reveals: mild mouth dryness, adequate dental hygiene and mild airway crowding, due to smaller airway entry. Mallampati is class II. Tongue protrudes centrally and palate elevates symmetrically. Tonsils are absent. Neck size is 14.5 inches. She has no notable overbite. Nasal inspection reveals no significant nasal mucosal bogginess or redness and no septal deviation, mildly narrow nasal passages.   Chest: Clear to auscultation without wheezing, rhonchi or crackles noted.  Heart: S1+S2+0, regular and normal without murmurs, rubs or gallops noted.   Abdomen: Soft, non-tender and non-distended with normal bowel sounds appreciated on auscultation.  Extremities: There is no pitting edema in the distal lower extremities bilaterally.   Skin: Warm and dry without trophic changes noted.  Musculoskeletal: exam reveals no obvious joint deformities, tenderness or joint swelling or erythema.   Neurologically:  Mental status: The patient is awake, alert and oriented in all 4 spheres. Her immediate and remote memory, attention, language skills and fund of knowledge are appropriate. There is no evidence of aphasia, agnosia,  apraxia or anomia. Speech is clear with normal prosody and enunciation. Thought process is linear. Mood is normal and affect is normal.  Cranial nerves II - XII are as described above under HEENT exam. In addition: shoulder shrug is normal with equal shoulder height noted. Motor exam: Normal bulk, strength and tone is noted. There is no drift, tremor or rebound. Romberg is negative. Fine motor skills and coordination: intact with normal finger taps, normal hand movements, normal rapid alternating patting, normal foot taps and normal foot agility.  Cerebellar testing: No dysmetria or intention tremor. Heel to shin is unremarkable bilaterally. There is no truncal or gait ataxia.  Sensory exam: intact to light touch in the upper and lower extremities.  Gait, station and balance: She stands easily. No veering to one side is noted. No leaning to one side is noted. Posture is Meyers-appropriate and stance is narrow based. Gait shows normal stride length and normal pace. No problems turning are noted. Tandem walk is unremarkable.  Assessment and Plan:  In summary, BEONCA GIBB is a very pleasant 57 y.o.-year old female with an underlying medical history of migraines, epilepsy, hypertension, hyperlipidemia, thyroid disease, depression, anemia and obesity, whose history and physical exam are concerning for obstructive sleep apnea (OSA). I had a long chat with the patient and her friend about my findings and the diagnosis of OSA, its prognosis and treatment options. We talked about medical treatments, surgical interventions and non-pharmacological approaches. I explained in particular the risks and ramifications of untreated moderate to severe OSA, especially with respect to developing cardiovascular disease down the Road, including congestive heart failure, difficult to treat hypertension, cardiac arrhythmias, or stroke. Even type 2 diabetes has, in part, been linked to untreated OSA. Symptoms of untreated OSA  include daytime sleepiness, memory problems, mood irritability and mood disorder such as depression and anxiety, lack of energy, as well as recurrent headaches, especially morning headaches. We talked about trying to maintain a healthy lifestyle in general, as well as the importance of weight control. I encouraged the patient to eat healthy, exercise daily and keep well hydrated, to keep a scheduled bedtime and wake time routine, to not skip any meals and eat healthy snacks in between meals. I advised the patient not to drive when feeling sleepy. I recommended the following at this time: sleep study with  potential positive airway pressure titration. (We will score hypopneas at 4%).   I explained the sleep test procedure to the patient and also outlined possible surgical and non-surgical treatment options of OSA, including the use of a custom-made dental device (which would require a referral to a specialist dentist or oral surgeon), upper airway surgical options, such as pillar implants, radiofrequency surgery, tongue base surgery, and UPPP (which would involve a referral to an ENT surgeon). Rarely, jaw surgery such as mandibular advancement may be considered.  I also explained the CPAP treatment option to the patient, who indicated that she would be willing to try CPAP if the need arises. I explained the importance of being compliant with PAP treatment, not only for insurance purposes but primarily to improve Her symptoms, and for the patient's long term health benefit, including to reduce Her cardiovascular risks. I answered all their questions today and the patient and her friend were in agreement. I plan to see her back after the sleep study is completed and encouraged her to call with any interim questions, concerns, problems or updates.   Thank you very much for allowing me to participate in the care of this nice patient. If I can be of any further assistance to you please do not hesitate to call me at  [phone].  Sincerely,   Erica Age, MD, PhD

## 2017-12-09 NOTE — Telephone Encounter (Signed)
Patient called re: patient would like to discuss symptoms-swelling-severe sweliing,fatigue, headaches, aching joints. Some days are normal-other days she is extremely swollen.Please call patient at ph# [phone] to advise.

## 2017-12-10 ENCOUNTER — Encounter: Payer: Self-pay | Admitting: Endocrinology

## 2017-12-10 ENCOUNTER — Ambulatory Visit (INDEPENDENT_AMBULATORY_CARE_PROVIDER_SITE_OTHER): Payer: Medicare Other | Admitting: Endocrinology

## 2017-12-10 VITALS — BP 102/60 | HR 84 | Ht 62.5 in | Wt 180.6 lb

## 2017-12-10 DIAGNOSIS — R635 Abnormal weight gain: Secondary | ICD-10-CM | POA: Insufficient documentation

## 2017-12-10 DIAGNOSIS — E890 Postprocedural hypothyroidism: Secondary | ICD-10-CM | POA: Diagnosis not present

## 2017-12-10 LAB — TSH: TSH: 1.53 u[IU]/mL (ref 0.35–4.50)

## 2017-12-10 LAB — T4, FREE: FREE T4: 0.76 ng/dL (ref 0.60–1.60)

## 2017-12-10 NOTE — Patient Instructions (Addendum)
blood tests are requested for you today.  We'll let you know about the results.  If this is normal, please see Dr Dorthy Cooler for your symptoms.   Let's plan to recheck the ultrasound in the future.  Please come back for a follow-up appointment in 6 months.

## 2017-12-10 NOTE — Progress Notes (Signed)
Subjective:    Patient ID: Erica Meyers, female    DOB: [birth year], 57 y.o.   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  Despite reduction in size (presumably attributable to provided history of radioactive iodine ablative therapy), nodule #1 within the right lobe of the thyroid continues to meet imaging criteria to recommend bx).  Pt reports slight swelling throughout the body, and assoc arthralgias.  Past Medical History:  Diagnosis Date  . Anemia   . Depression   . Dyslipidemia   . Epilepsy ([place])   . Headache   . Hypertension   . Thyroid disease     Past Surgical History:  Procedure Laterality Date  . CESAREAN SECTION    . TONSILLECTOMY    . TUBAL LIGATION      Social History   Socioeconomic History  . Marital status: Single    Spouse name: Not on file  . Number of children: Not on file  . Years of education: Not on file  . Highest education level: Not on file  Occupational History  . Not on file  Social Needs  . Financial resource strain: Not on file  . Food insecurity:    Worry: Not on file    Inability: Not on file  . Transportation needs:    Medical: Not on file    Non-medical: Not on file  Tobacco Use  . Smoking status: Never Smoker  . Smokeless tobacco: Never Used  Substance and Sexual Activity  . Alcohol use: No  . Drug use: No  . Sexual activity: Not on file  Lifestyle  . Physical activity:    Days per week: Not on file    Minutes per session: Not on file  . Stress: Not on file  Relationships  . Social connections:    Talks on phone: Not on file    Gets together: Not on file    Attends religious service: Not on file    Active member  of club or organization: Not on file    Attends meetings of clubs or organizations: Not on file    Relationship status: Not on file  . Intimate partner violence:    Fear of current or ex partner: Not on file    Emotionally abused: Not on file    Physically abused: Not on file    Forced sexual activity: Not on file  Other Topics Concern  . Not on file  Social History Narrative  . Not on file    Current Outpatient Medications on File Prior to Visit  Medication Sig Dispense Refill  . atorvastatin (LIPITOR) 20 MG tablet Take 20 mg by mouth daily.    . cholecalciferol (VITAMIN D) 1000 UNITS tablet Take 1,000 Units by mouth daily.    Marland Kitchen lamoTRIgine (LAMICTAL) 150 MG tablet TAKE 2 TABLETS BY MOUTH TWICE A DAY 360 tablet 2  . levothyroxine (SYNTHROID, LEVOTHROID) 75 MCG tablet Take 1 tablet (75 mcg total) by mouth daily before breakfast. 90 tablet 3  . lisinopril-hydrochlorothiazide (PRINZIDE,ZESTORETIC) 20-25 MG tablet TAKE 1 TABLET BY MOUTH ONCE A DAY (WILL PAY 3/14)  11  . meloxicam (MOBIC) 7.5 MG tablet Take 7.5 mg by mouth daily.    Marland Kitchen  Multiple Vitamin (MULTIVITAMIN WITH MINERALS) TABS tablet Take 1 tablet by mouth daily.    . rizatriptan (MAXALT-MLT) 10 MG disintegrating tablet Take 1 tablet (10 mg total) by mouth as needed for migraine. May repeat in 2 hours if needed 12 tablet 11  . sertraline (ZOLOFT) 100 MG tablet Take 100 mg by mouth daily.  0  . topiramate (TOPAMAX) 100 MG tablet Take 1 tablet (100 mg total) by mouth 2 (two) times daily. 180 tablet 3  . triamcinolone cream (KENALOG) 0.1 % Apply 1 application topically 2 (two) times daily. 30 g 0   No current facility-administered medications on file prior to visit.     Allergies  Allergen Reactions  . Penicillins     Childhood, does not recall reaction.    Family History  Problem Relation Age of Onset  . Hypertension Mother   . Stroke Mother   . Thyroid disease Mother     BP 102/60 (BP Location: Right Arm, Patient  Position: Sitting, Cuff Size: Normal)   Pulse 84   Ht 5' 2.5" (1.588 m)   Wt 180 lb 9.6 oz (81.9 kg)   SpO2 93%   BMI 32.51 kg/m   Review of Systems She has fatigue and weight gain.     Objective:   Physical Exam VITAL SIGNS:  See vs page GENERAL: no distress NECK: There is no palpable thyroid enlargement.  No thyroid nodule is palpable.  No palpable lymphadenopathy at the anterior neck. Ext: trace bilat leg edema      Assessment & Plan:  Multinodular goiter: non-palpable Hypothyroidism: recheck today Diffuse swelling, new, uncertain etiology  Patient Instructions  blood tests are requested for you today.  We'll let you know about the results.  If this is normal, please see Dr Dorthy Cooler for your symptoms.   Let's plan to recheck the ultrasound in the future.  Please come back for a follow-up appointment in 6 months.

## 2017-12-11 LAB — LAMOTRIGINE LEVEL: LAMOTRIGINE LVL: 3.8 ug/mL (ref 2.0–20.0)

## 2017-12-16 ENCOUNTER — Telehealth: Payer: Self-pay

## 2017-12-16 NOTE — Telephone Encounter (Signed)
-----   Message from Dennie Bible, NP sent at 12/16/2017  8:37 AM EST ----- Good level of Lamictal continue same dose.  Please call the patient

## 2017-12-16 NOTE — Telephone Encounter (Signed)
Unable to get in contact with Erica Meyers. A detailed voicemail( DPR verified) with her lab results and instructions from [place]. Office number was provided in case she has any further questions.

## 2018-01-06 ENCOUNTER — Ambulatory Visit: Payer: Medicare Other | Admitting: Endocrinology

## 2018-01-19 ENCOUNTER — Ambulatory Visit (INDEPENDENT_AMBULATORY_CARE_PROVIDER_SITE_OTHER): Payer: Medicare Other | Admitting: Neurology

## 2018-01-19 DIAGNOSIS — G4719 Other hypersomnia: Secondary | ICD-10-CM

## 2018-01-19 DIAGNOSIS — E669 Obesity, unspecified: Secondary | ICD-10-CM

## 2018-01-19 DIAGNOSIS — G4710 Hypersomnia, unspecified: Secondary | ICD-10-CM | POA: Diagnosis not present

## 2018-01-19 DIAGNOSIS — R0683 Snoring: Secondary | ICD-10-CM

## 2018-01-19 DIAGNOSIS — Z820 Family history of epilepsy and other diseases of the nervous system: Secondary | ICD-10-CM

## 2018-01-19 DIAGNOSIS — G4720 Circadian rhythm sleep disorder, unspecified type: Secondary | ICD-10-CM

## 2018-01-19 DIAGNOSIS — R519 Headache, unspecified: Secondary | ICD-10-CM

## 2018-01-19 DIAGNOSIS — G40909 Epilepsy, unspecified, not intractable, without status epilepticus: Secondary | ICD-10-CM

## 2018-01-19 DIAGNOSIS — R351 Nocturia: Secondary | ICD-10-CM

## 2018-01-19 DIAGNOSIS — R51 Headache: Secondary | ICD-10-CM

## 2018-01-30 ENCOUNTER — Telehealth: Payer: Self-pay

## 2018-01-30 NOTE — Progress Notes (Signed)
Patient referred by CM, seen by me on 12/09/17, diagnostic PSG on 01/19/18.   Please call and notify the patient that the recent sleep study did not show any significant obstructive sleep apnea with the exception of mild intermittent snoring and mild REM-related OSA. For this, treatment with positive airway pressure is not warranted; weight loss and avoidance of the supine sleep position will likely suffice as treatment.  She can FU with CM and PCP as planned.  Thanks,  Star Age, MD, PhD [facility])

## 2018-01-30 NOTE — Procedures (Signed)
PATIENT'S NAME:  Erica Meyers, Acord DOB:      03/19/1960      MR#:    [id number]     DATE OF RECORDING: 01/19/2018 REFERRING M.D.:  [name], NP Study Performed:   Baseline Polysomnogram HISTORY: 58 year old woman with a history of migraines, epilepsy, hypertension, hyperlipidemia, thyroid disease, depression, anemia and obesity, who reports snoring and excessive daytime somnolence. Her sleep study on 12/10/2012 showed no significant sleep disordered breathing. She has gained some weight in the interim, in the realm of 7 pounds. Her Epworth sleepiness score is 11 out of 24. The patient's weight 180 pounds with a height of 62 (inches), resulting in a BMI of 32.4 kg/m2. The patient's neck circumference measured 14.5 inches.  CURRENT MEDICATIONS: Lipitor, Vitamin D, Lamictal, Synthroid, Prinzide, Mobic, Multivitamins, Maxalt, Zoloft, Topamax, Kenalog.   PROCEDURE:  This is a multichannel digital polysomnogram utilizing the Somnostar 11.2 system.  Electrodes and sensors were applied and monitored per AASM Specifications.   EEG, EOG, Chin and Limb EMG, were sampled at 200 Hz.  ECG, Snore and Nasal Pressure, Thermal Airflow, Respiratory Effort, CPAP Flow and Pressure, Oximetry was sampled at 50 Hz. Digital video and audio were recorded.      BASELINE STUDY  Lights Out was at 22:38 and Lights On at 05:02.  Total recording time (TRT) was 385 minutes, with a total sleep time (TST) of 280 minutes.   The patient's sleep latency was 91.5 minutes, which is delayed. REM latency was 132.5 minutes, which is delayed. The sleep efficiency was 72.7%.     SLEEP ARCHITECTURE: WASO (Wake after sleep onset) was 13 minutes. There were 1 minutes in Stage N1, 174.5 minutes Stage N2, 74 minutes Stage N3 and 30.5 minutes in Stage REM.  The percentage of Stage N1 was .4%, Stage N2 was 62.3%, which is increased, Stage N3 was 26.4% and Stage R (REM sleep) was 10.9%, which is reduced. The arousals were noted as: 22 were  spontaneous, 0 were associated with PLMs, 6 were associated with respiratory events.  RESPIRATORY ANALYSIS:  There were a total of 20 respiratory events:  0 obstructive apneas, 0 central apneas and 0 mixed apneas with a total of 0 apneas and an apnea index (AI) of 0 /hour. There were 20 hypopneas with a hypopnea index of 4.3 /hour. The patient also had 0 respiratory event related arousals (RERAs).      The total APNEA/HYPOPNEA INDEX (AHI) was 4.3 /hour and the total RESPIRATORY DISTURBANCE INDEX was 0. 4.3 /hour.  6 events occurred in REM sleep and 28 events in NREM. The REM AHI was 0. 11.8 /hour, versus a non-REM AHI of 3.4. The patient spent 280 minutes of total sleep time in the supine position and 0 minutes in non-supine.. The supine AHI was 4.3 versus a non-supine AHI of 0.0.  OXYGEN SATURATION & C02:  The Wake baseline 02 saturation was 91%, with the lowest being 84%. Time spent below 89% saturation equaled 8 minutes. PERIODIC LIMB MOVEMENTS: The patient had a total of 0 Periodic Limb Movements.  The Periodic Limb Movement (PLM) index was 0 and the PLM Arousal index was 0/hour.  Audio and video analysis did not show any abnormal or unusual movements, behaviors, phonations or vocalizations. The patient took no bathroom breaks. Mild intermittent snoring was noted. The EKG was in keeping with normal sinus rhythm (NSR).  Post-study, the patient indicated that sleep was better than usual.   IMPRESSION:  1. Primary Snoring 2. Dysfunctions associated with  sleep stages or arousal from sleep  RECOMMENDATIONS:  1. This study does not demonstrate any significant obstructive or central sleep disordered breathing with the exception of snoring and mild REM-related OSA. For this, treatment with positive airway pressure is not warranted; weight loss and avoidance of the supine sleep position will likely suffice as treatment.  2. This study shows some sleep fragmentation and mildly abnormal sleep stage  percentages; these are nonspecific findings and per se do not signify an intrinsic sleep disorder or a cause for the patient's sleep-related symptoms. Causes include (but are not limited to) the first night effect of the sleep study, circadian rhythm disturbances, medication effect or an underlying mood disorder or medical problem.  3. The patient should be cautioned not to drive, work at heights, or operate dangerous or heavy equipment when tired or sleepy. Review and reiteration of good sleep hygiene measures should be pursued with any patient. 4. The patient will be advised to follow up with the referring provider, who will be notified of the test results.  I certify that I have reviewed the entire raw data recording prior to the issuance of this report in accordance with the Standards of Accreditation of the American Academy of Sleep Medicine (AASM)   Star Age, MD, PhD Diplomat, American Board of Neurology and Sleep Medicine (Neurology and Sleep Medicine)

## 2018-01-30 NOTE — Telephone Encounter (Signed)
I called pt and discussed her sleep study results. Pt is agreeable to working on weight loss and avoid supine sleep. Pt will follow up with Hoyle Sauer, NP and her PCP. Pt verbalized understanding of results. Pt had no questions at this time but was encouraged to call back if questions arise.

## 2018-01-30 NOTE — Telephone Encounter (Signed)
-----   Message from Star Age, MD sent at 01/30/2018  7:50 AM EST ----- Patient referred by CM, seen by me on 12/09/17, diagnostic PSG on 01/19/18.   Please call and notify the patient that the recent sleep study did not show any significant obstructive sleep apnea with the exception of mild intermittent snoring and mild REM-related OSA. For this, treatment with positive airway pressure is not warranted; weight loss and avoidance of the supine sleep position will likely suffice as treatment.  She can FU with CM and PCP as planned.  Thanks,  Star Age, MD, PhD [facility])

## 2018-04-27 ENCOUNTER — Emergency Department (HOSPITAL_BASED_OUTPATIENT_CLINIC_OR_DEPARTMENT_OTHER): Payer: Medicare Other

## 2018-04-27 ENCOUNTER — Other Ambulatory Visit: Payer: Self-pay

## 2018-04-27 ENCOUNTER — Emergency Department (HOSPITAL_BASED_OUTPATIENT_CLINIC_OR_DEPARTMENT_OTHER)
Admission: EM | Admit: 2018-04-27 | Discharge: 2018-04-27 | Disposition: A | Discharge status: Home / Self Care | Payer: Medicare Other | Attending: Emergency Medicine | Admitting: Emergency Medicine

## 2018-04-27 ENCOUNTER — Encounter (HOSPITAL_BASED_OUTPATIENT_CLINIC_OR_DEPARTMENT_OTHER): Payer: Self-pay | Admitting: *Deleted

## 2018-04-27 DIAGNOSIS — R079 Chest pain, unspecified: Secondary | ICD-10-CM | POA: Insufficient documentation

## 2018-04-27 DIAGNOSIS — E079 Disorder of thyroid, unspecified: Secondary | ICD-10-CM | POA: Diagnosis not present

## 2018-04-27 DIAGNOSIS — R51 Headache: Secondary | ICD-10-CM | POA: Diagnosis not present

## 2018-04-27 DIAGNOSIS — I10 Essential (primary) hypertension: Secondary | ICD-10-CM | POA: Diagnosis not present

## 2018-04-27 DIAGNOSIS — M542 Cervicalgia: Secondary | ICD-10-CM | POA: Diagnosis not present

## 2018-04-27 LAB — CBC
HCT: 46.6 % — ABNORMAL HIGH (ref 36.0–46.0)
Hemoglobin: 14.3 g/dL (ref 12.0–15.0)
MCH: 29 pg (ref 26.0–34.0)
MCHC: 30.7 g/dL (ref 30.0–36.0)
MCV: 94.5 fL (ref 80.0–100.0)
Platelets: 248 10*3/uL (ref 150–400)
RBC: 4.93 MIL/uL (ref 3.87–5.11)
RDW: 13.7 % (ref 11.5–15.5)
WBC: 6.8 10*3/uL (ref 4.0–10.5)
nRBC: 0 % (ref 0.0–0.2)

## 2018-04-27 LAB — BASIC METABOLIC PANEL
Anion gap: 7 (ref 5–15)
BUN: 15 mg/dL (ref 6–20)
CO2: 25 mmol/L (ref 22–32)
Calcium: 9.6 mg/dL (ref 8.9–10.3)
Chloride: 102 mmol/L (ref 98–111)
Creatinine, Ser: 1.08 mg/dL — ABNORMAL HIGH (ref 0.44–1.00)
GFR calc Af Amer: >60 mL/min (ref >60)
GFR calc non Af Amer: 57 mL/min — ABNORMAL LOW (ref >60)
Glucose, Bld: 89 mg/dL (ref 70–99)
Potassium: 3.7 mmol/L (ref 3.5–5.1)
Sodium: 134 mmol/L — ABNORMAL LOW (ref 135–145)

## 2018-04-27 LAB — TROPONIN I: Troponin I: <0.03 ng/mL (ref <0.03)

## 2018-04-27 NOTE — ED Notes (Signed)
Patient transported to X-ray 

## 2018-04-27 NOTE — ED Notes (Signed)
Attempted IV x 2 to right arm, labs obtained but IV unsuccessful, tol well

## 2018-04-27 NOTE — Discharge Instructions (Addendum)
You were evaluated in the Emergency Department and after careful evaluation, we did not find any emergent condition requiring admission or further testing in the hospital. ° °Please return to the Emergency Department if you experience any worsening of your condition.  We encourage you to follow up with a primary care provider.  Thank you for allowing us to be a part of your care. °

## 2018-04-27 NOTE — ED Notes (Signed)
ED Provider at bedside. 

## 2018-04-27 NOTE — ED Notes (Signed)
Pt called to RN desk stating he was experiencing chest pain. RN went into assess and noted no acute distress. She stated it was in the upper left chest, sharp in nature, and was already dissipating when RN got into room. Provider made aware.

## 2018-04-27 NOTE — ED Provider Notes (Signed)
[facility] Emergency Department Provider Note MRN:  [id number]  Arrival date & time: 04/27/18     Chief Complaint   Chest Pain   History of Present Illness   Erica Meyers is a 58 y.o. year-old female with a history of epilepsy, migraines, hypertension presenting to the ED with chief complaint of chest pain.  Persistent intermittent chest pain for 5 or 6 days.  Located in the left upper chest, described as sharp.  Also endorsing some intermittent left-sided neck pains that feels like "slept on it wrong".  Also endorsing mild headache intermittent, which is normal for her.  Denying dizziness, no diaphoresis, no nausea, no vomiting, no shortness of breath.  Pain is moderate in severity, no exacerbating relieving factors.  Review of Systems  A complete 10 system review of systems was obtained and all systems are negative except as noted in the HPI and PMH.   Patient's Health History    Past Medical History:  Diagnosis Date  . Anemia   . Depression   . Dyslipidemia   . Epilepsy ([place])   . Headache   . Hypertension   . Thyroid disease     Past Surgical History:  Procedure Laterality Date  . CESAREAN SECTION    . TONSILLECTOMY    . TUBAL LIGATION    . VEIN LIGATION AND STRIPPING Left     Family History  Problem Relation Age of Onset  . Hypertension Mother   . Stroke Mother   . Thyroid disease Mother     Social History   Socioeconomic History  . Marital status: Single    Spouse name: Not on file  . Number of children: Not on file  . Years of education: Not on file  . Highest education level: Not on file  Occupational History  . Not on file  Social Needs  . Financial resource strain: Not on file  . Food insecurity:    Worry: Not on file    Inability: Not on file  . Transportation needs:    Medical: Not on file    Non-medical: Not on file  Tobacco Use  . Smoking status: Never Smoker  . Smokeless tobacco: Never Used  Substance and  Sexual Activity  . Alcohol use: No  . Drug use: No  . Sexual activity: Not on file  Lifestyle  . Physical activity:    Days per week: Not on file    Minutes per session: Not on file  . Stress: Not on file  Relationships  . Social connections:    Talks on phone: Not on file    Gets together: Not on file    Attends religious service: Not on file    Active member of club or organization: Not on file    Attends meetings of clubs or organizations: Not on file    Relationship status: Not on file  . Intimate partner violence:    Fear of current or ex partner: Not on file    Emotionally abused: Not on file    Physically abused: Not on file    Forced sexual activity: Not on file  Other Topics Concern  . Not on file  Social History Narrative  . Not on file     Physical Exam  Vital Signs and Nursing Notes reviewed Vitals:   04/27/18 1809 04/27/18 1830  BP: 116/75 125/76  Pulse:  62  Resp: (!) 28 18  Temp:    SpO2:  98%  CONSTITUTIONAL: Well-appearing, NAD NEURO:  Alert and oriented x 3, no focal deficits EYES:  eyes equal and reactive ENT/NECK:  no LAD, no JVD CARDIO: Regular rate, well-perfused, normal S1 and S2 PULM:  CTAB no wheezing or rhonchi GI/GU:  normal bowel sounds, non-distended, non-tender MSK/SPINE:  No gross deformities, no edema SKIN:  no rash, atraumatic PSYCH:  Appropriate speech and behavior  Diagnostic and Interventional Summary    EKG Interpretation  Date/Time:  Sunday April 27 2018 18:03:05 EDT Ventricular Rate:  71 PR Interval:    QRS Duration: 87 QT Interval:  394 QTC Calculation: 429 R Axis:   1 Text Interpretation:  Sinus rhythm Borderline prolonged PR interval Anterior infarct, old Confirmed by Gerlene Fee [phone]) on 04/27/2018 6:59:07 PM      Labs Reviewed  BASIC METABOLIC PANEL - Abnormal; Notable for the following components:      Result Value   Sodium 134 (*)    Creatinine, Ser 1.08 (*)    GFR calc non Af Amer 57 (*)    All  other components within normal limits  CBC - Abnormal; Notable for the following components:   HCT 46.6 (*)    All other components within normal limits  TROPONIN I    DG Chest 2 View  Final Result      Medications - No data to display   Procedures Critical Care  ED Course and Medical Decision Making  I have reviewed the triage vital signs and the nursing notes.  Pertinent labs & imaging results that were available during my care of the patient were reviewed by me and considered in my medical decision making (see below for details).  Atypical chest pain, favoring MSK given the description and the patient also having seemingly musculoskeletal neck pain.  Patient is with a normal neurological exam, normal vital signs, clear lungs, EKG is without acute concerns, troponin is negative.  No evidence of DVT on exam, normal oxygen saturations, little to no concern for PE.  Patient is appropriate for discharge.  Advised follow-up with PCP to discuss stress testing as an outpatient.  After the discussed management above, the patient was determined to be safe for discharge.  The patient was in agreement with this plan and all questions regarding their care were answered.  ED return precautions were discussed and the patient will return to the ED with any significant worsening of condition.  Barth Kirks. Sedonia Small, [place] [email or web address]  Final Clinical Impressions(s) / ED Diagnoses     ICD-10-CM   1. Chest pain, unspecified type R07.9     ED Discharge Orders    None         Maudie Flakes, MD 04/27/18 2010

## 2018-04-27 NOTE — ED Triage Notes (Signed)
Pt reports intermittent central chest pain for several days. Two days ago she had severe pain to her ?right arm. States she is usually very active but has been feeling fatigues and has had a "strange headache". Pt has Hx of epilepsy and has a seizure a couple of weeks ago.

## 2018-05-15 ENCOUNTER — Telehealth: Payer: Self-pay

## 2018-05-15 NOTE — Telephone Encounter (Signed)
Patient gave a verbal consent to do mychart visit   [place] E-VISIT FOR YOUR APPOINTMENT - PLEASE REVIEW IMPORTANT INFORMATION BELOW SEVERAL DAYS PRIOR TO YOUR APPOINTMENT  Due to the recent COVID-19 pandemic, we are transitioning in-person office visits to tele-medicine visits in an effort to decrease unnecessary exposure to our patients, their families, and staff. Medicare and most insurances are covering these visits without a copay needed. We also encourage you to sign up for MyChart if you have not already done so. You will need a smartphone if possible. For patients that do not have this, we can still complete the visit using a regular telephone but do prefer a smartphone to enable video when possible. You may have a family member that lives with you that can help. If possible, we also ask that you have a blood pressure cuff and scale at home to measure your blood pressure, heart rate and weight prior to your scheduled appointment. Patients with clinical needs that need an in-person evaluation and testing will still be able to come to the office if absolutely necessary. If you have any questions, feel free to call our office.     YOUR PROVIDER WILL BE USING THE FOLLOWING PLATFORM TO COMPLETE YOUR VISIT: MyChart  . IF USING MYCHART - How to Download the MyChart App to Your SmartPhone   - If Apple, go to [employer] and type in MyChart in the search bar and download the app. If Android, ask patient to go to [employer] and type in [place] in the search bar and download the app. The app is free but as with any other app downloads, your phone may require you to verify saved payment information or Apple/Android password.  - You will need to then log into the app with your MyChart username and password, and select [facility] as your healthcare provider to link the account. When it is time for your visit, go to the MyChart app, find appointments, and click Begin  Video Visit. Be sure to Select Allow for your device to access the Microphone and Camera for your visit. You will then be connected, and your provider will be with you shortly.  **If you have any issues connecting or need assistance, please contact MyChart service desk [phone])**  **If using a computer, in order to ensure the best quality for your visit, you will need to use either of the following Internet Browsers: Manufacturing engineer, or [employer]**   Erica Meyers will receive a telephone call from one of our [place] team members - your caller ID may say "Unknown caller." If this is a video visit, we will walk you through how to set up your device to be able to complete the visit. We will remind you check your blood pressure, heart rate and weight prior to your scheduled appointment. If you have an Apple Watch or Kardia, please upload any pertinent ECG strips the day before or morning of your appointment to [facility]. Our staff will also make sure you have reviewed the consent and agree to move forward with your scheduled tele-health visit.   THE DAY OF YOUR APPOINTMENT  Approximately 15 minutes prior to your scheduled appointment, you will receive a telephone call from one of [facility] team - your caller ID may say "Unknown caller."  Our staff will confirm medications, vital signs for the day and any symptoms you may be experiencing. Please have this  information available prior to the time of visit start. It may also be helpful for you to have a pad of paper and pen handy for any instructions given during your visit. They will also walk you through joining the smartphone meeting if this is a video visit.    CONSENT FOR TELE-HEALTH VISIT - PLEASE REVIEW  I hereby voluntarily request, consent and authorize [facility] and its employed or contracted physicians, physician assistants, nurse practitioners or other licensed health care professionals (the  Practitioner), to provide me with telemedicine health care services (the "Services") as deemed necessary by the treating Practitioner. I acknowledge and consent to receive the Services by the Practitioner via telemedicine. I understand that the telemedicine visit will involve communicating with the Practitioner through live audiovisual communication technology and the disclosure of certain medical information by electronic transmission. I acknowledge that I have been given the opportunity to request an in-person assessment or other available alternative prior to the telemedicine visit and am voluntarily participating in the telemedicine visit.  I understand that I have the right to withhold or withdraw my consent to the use of telemedicine in the course of my care at any time, without affecting my right to future care or treatment, and that the Practitioner or I may terminate the telemedicine visit at any time. I understand that I have the right to inspect all information obtained and/or recorded in the course of the telemedicine visit and may receive copies of available information for a reasonable fee.  I understand that some of the potential risks of receiving the Services via telemedicine include:  Marland Kitchen Delay or interruption in medical evaluation due to technological equipment failure or disruption; . Information transmitted may not be sufficient (e.g. poor resolution of images) to allow for appropriate medical decision making by the Practitioner; and/or  . In rare instances, security protocols could fail, causing a breach of personal health information.  Furthermore, I acknowledge that it is my responsibility to provide information about my medical history, conditions and care that is complete and accurate to the best of my ability. I acknowledge that Practitioner's advice, recommendations, and/or decision may be based on factors not within their control, such as incomplete or inaccurate data provided by me  or distortions of diagnostic images or specimens that may result from electronic transmissions. I understand that the practice of medicine is not an exact science and that Practitioner makes no warranties or guarantees regarding treatment outcomes. I acknowledge that I will receive a copy of this consent concurrently upon execution via email to the email address I last provided but may also request a printed copy by calling the office of [facility].    I understand that my insurance will be billed for this visit.   I have read or had this consent read to me. . I understand the contents of this consent, which adequately explains the benefits and risks of the Services being provided via telemedicine.  . I have been provided ample opportunity to ask questions regarding this consent and the Services and have had my questions answered to my satisfaction. . I give my informed consent for the services to be provided through the use of telemedicine in my medical care  By participating in this telemedicine visit I agree to the above.

## 2018-05-16 NOTE — Progress Notes (Signed)
Virtual Visit via Video Note   This visit type was conducted due to national recommendations for restrictions regarding the COVID-19 Pandemic (e.g. social distancing) in an effort to limit this patient's exposure and mitigate transmission in our community.  Due to her co-morbid illnesses, this patient is at least at moderate risk for complications without adequate follow up.  This format is felt to be most appropriate for this patient at this time.  All issues noted in this document were discussed and addressed.  A limited physical exam was performed with this format.  Please refer to the patient's chart for her consent to telehealth for [facility].   Video visit for new patient done using Doximity   Evaluation Performed:  Follow-up visit  Date:  05/19/2018   ID:  Erica Meyers, DOB 1960-07-15, MRN [id number]  Patient Location: Home Provider Location: Office  PCP:  Lujean Amel, MD  Cardiologist:  New/Shatori Bertucci Electrophysiologist:  None   Chief Complaint:  Chest Pain  Referred by Dr Dorthy Cooler   History of Present Illness:    Erica Meyers is a 58 y.o. female with HTN, Seizures, and migraines Seen in ER 04/27/18 for chest pain. Persistent for 5-6 days Atypical sharp pain in upper left chest Also with some left sided neck pain like she slept poorly on it R/O normal CXR and ECG D/C home with diagnosis of muscular pain. To f/u with primary who referred her to cardiology for stress testing   She has severe anxiety and depression Was teary eyed during encounter Has two grandchildren living with her now and they may have autism. She indicates that her kids don't listen to her advice She is about to see a therapist soon. Still with occasional exertional pain in chest since d/c   The patient does not have symptoms concerning for COVID-19 infection (fever, chills, cough, or new shortness of breath).    Past Medical History:  Diagnosis Date  . Anemia   . Depression   . Dyslipidemia    . Epilepsy ([place])   . Headache   . Hypertension   . Thyroid disease    Past Surgical History:  Procedure Laterality Date  . CESAREAN SECTION    . TONSILLECTOMY    . TUBAL LIGATION    . VEIN LIGATION AND STRIPPING Left      Current Meds  Medication Sig  . atorvastatin (LIPITOR) 20 MG tablet Take 20 mg by mouth daily.  . cholecalciferol (VITAMIN D) 1000 UNITS tablet Take 1,000 Units by mouth daily.  Marland Kitchen lamoTRIgine (LAMICTAL) 150 MG tablet TAKE 2 TABLETS BY MOUTH TWICE A DAY  . levothyroxine (SYNTHROID, LEVOTHROID) 75 MCG tablet Take 1 tablet (75 mcg total) by mouth daily before breakfast.  . lisinopril-hydrochlorothiazide (PRINZIDE,ZESTORETIC) 20-25 MG tablet TAKE 1 TABLET BY MOUTH ONCE A DAY (WILL PAY 3/14)  . meloxicam (MOBIC) 7.5 MG tablet Take 7.5 mg by mouth daily.  . Multiple Vitamin (MULTIVITAMIN WITH MINERALS) TABS tablet Take 1 tablet by mouth daily.  . rizatriptan (MAXALT-MLT) 10 MG disintegrating tablet Take 1 tablet (10 mg total) by mouth as needed for migraine. May repeat in 2 hours if needed  . sertraline (ZOLOFT) 100 MG tablet Take 100 mg by mouth daily.  Marland Kitchen topiramate (TOPAMAX) 100 MG tablet Take 1 tablet (100 mg total) by mouth 2 (two) times daily.  Marland Kitchen triamcinolone cream (KENALOG) 0.1 % Apply 1 application topically 2 (two) times daily.     Allergies:   Penicillins   Social History  Tobacco Use  . Smoking status: Never Smoker  . Smokeless tobacco: Never Used  Substance Use Topics  . Alcohol use: No  . Drug use: No     Family Hx: The patient's family history includes Hypertension in her mother; Stroke in her mother; Thyroid disease in her mother.  ROS:   Please see the history of present illness.     All other systems reviewed and are negative.   Prior CV studies:   The following studies were reviewed today:  ER notes labs CXR and ECG   Labs/Other Tests and Data Reviewed:    EKG:   NSR normal ECG   Recent Labs: 12/10/2017: TSH 1.53 04/27/2018:  BUN 15; Creatinine, Ser 1.08; Hemoglobin 14.3; Platelets 248; Potassium 3.7; Sodium 134   Recent Lipid Panel No results found for: CHOL, TRIG, HDL, CHOLHDL, LDLCALC, LDLDIRECT  Wt Readings from Last 3 Encounters:  05/19/18 80.3 kg  04/27/18 77.1 kg  12/10/17 81.9 kg     Objective:    Vital Signs:  Ht 5' 2.5" (1.588 m)   Wt 80.3 kg   LMP 01/05/2016   BMI 31.86 kg/m    No JVP elevation No tachypnea No edema Skin warm and dry   ASSESSMENT & PLAN:    1. Chest Pain : atypical will f/u with ETT when COVId 19 restrictions lifted doubt cardiac etiology 2. HLD:  On statin labs with primary  3. DM:  Discussed low carb diet.  Target hemoglobin A1c is 6.5 or less.  Continue current medications. 4. HTN:  Well controlled.  Continue current medications and low sodium Dash type diet.   5. Seizures:  Quiescent continue Lamictal 6. Thyroid:  Continue replacement TSH normal  7. Anxiety/Depression:  Major issue to start seeing therapist   COVID-19 Education: The signs and symptoms of COVID-19 were discussed with the patient and how to seek care for testing (follow up with PCP or arrange E-visit).  The importance of social distancing was discussed today.  Time:   Today, I have spent 30 minutes with the patient with telehealth technology discussing the above problems.     Medication Adjustments/Labs and Tests Ordered: Current medicines are reviewed at length with the patient today.  Concerns regarding medicines are outlined above.   Tests Ordered:  ETT  Medication Changes: No orders of the defined types were placed in this encounter.   Disposition:  Follow up prn  Signed, Jenkins Rouge, MD  05/19/2018 10:20 AM    [place]

## 2018-05-19 ENCOUNTER — Other Ambulatory Visit: Payer: Self-pay

## 2018-05-19 ENCOUNTER — Telehealth (INDEPENDENT_AMBULATORY_CARE_PROVIDER_SITE_OTHER): Payer: Medicare Other | Admitting: Cardiovascular Disease

## 2018-05-19 ENCOUNTER — Encounter: Payer: Self-pay | Admitting: Cardiovascular Disease

## 2018-05-19 ENCOUNTER — Telehealth: Payer: Medicare Other | Admitting: Cardiovascular Disease

## 2018-05-19 VITALS — Ht 62.5 in | Wt 177.0 lb

## 2018-05-19 DIAGNOSIS — R0789 Other chest pain: Secondary | ICD-10-CM

## 2018-05-19 DIAGNOSIS — R079 Chest pain, unspecified: Secondary | ICD-10-CM

## 2018-05-19 NOTE — Patient Instructions (Addendum)
Medication Instructions:   If you need a refill on your cardiac medications before your next appointment, please call your pharmacy.   Lab work:  If you have labs (blood work) drawn today and your tests are completely normal, you will receive your results only by: Marland Kitchen MyChart Message (if you have MyChart) OR . A paper copy in the mail If you have any lab test that is abnormal or we need to change your treatment, we will call you to review the results.  Testing/Procedures: Your physician has requested that you have an exercise tolerance test in 4 to 6 weeks. For further information please visit [email or web address]. Please also follow instruction sheet, as given.  Please do not eat, drink, or use tobacco products 4 hours before your test Wear clothing you would got to the gym in, tennis shoes, shorts, jogging pants, t-shirt, ect. You do not need to hold any of your medications for this test. Make sure you call in 24 hours in advance, if you need to cancel your appointment. [phone]  Follow-Up: At [facility], you and your health needs are our priority.  As part of our continuing mission to provide you with exceptional heart care, we have created designated Provider Care Teams.  These Care Teams include your primary Cardiologist (physician) and Advanced Practice Providers (APPs -  Physician Assistants and Nurse Practitioners) who all work together to provide you with the care you need, when you need it. . You will need a follow up appointment as needed with Dr. Johnsie Cancel as long as exercise tolerance test is normal.

## 2018-05-22 ENCOUNTER — Telehealth: Payer: Self-pay | Admitting: *Deleted

## 2018-05-22 NOTE — Telephone Encounter (Signed)
Received notice from [facility] Drug Utilization Review that pt receiving 12 tabs every 30 days rizatriptan for the last 3 months.  Taking 2 x week could cause drug induced rebound headaches.  I called pt.  She was doing well, then due to covid 19, stressors has noted increase of migraines.  She is not using 12 per month.  She is on automatic refills, and told her that she can relay to pharmacy to change this to only when she calls for it.  She stated she would.  She did not have f/u in system, so I did go ahead and make 6 month f/u.  (she is aware of balance (sleep)  and will make payment when called prior to her appt (VV webex).  She also mentioned that she has had some seizures witness by daughter (starring spells, and some night time jerking) and other family member, 58yo) in home.  She is keeping journal.  Compliant with sz medication. Lamotrigine 300mg  po bid.  Made appt with Sarah NP/Yan 06-03-18 at [place]. Due to current COVID 19 pandemic, our office is severely reducing in office visits for at least the next 2 weeks, in order to minimize the risk to our patients and healthcare providers.  Pt understands that although there may be some limitations with this type of visit, we will take all precautions to reduce any security or privacy concerns.  Pt understands that this will be treated like an in office visit and we will file with pt's insurance, and there may be a patient responsible charge related to this service.  Pt understands that although there may be some limitations with this type of visit, we will take all precautions to reduce any security or privacy concerns.  Pt understands that this will be treated like an in office visit and we will file with pt's insurance. Pt's email is [email or web address]. Pt understands that the [employer] webex software must be downloaded and operational on the device pt plans to use for the visit.

## 2018-05-22 NOTE — Telephone Encounter (Signed)
Patient has been added to Sarah's webex schedule and the e-mail has been sent.

## 2018-06-03 ENCOUNTER — Other Ambulatory Visit: Payer: Self-pay

## 2018-06-03 ENCOUNTER — Ambulatory Visit (INDEPENDENT_AMBULATORY_CARE_PROVIDER_SITE_OTHER): Payer: Medicare Other | Admitting: Neurology

## 2018-06-03 ENCOUNTER — Encounter: Payer: Self-pay | Admitting: Neurology

## 2018-06-03 DIAGNOSIS — G40209 Localization-related (focal) (partial) symptomatic epilepsy and epileptic syndromes with complex partial seizures, not intractable, without status epilepticus: Secondary | ICD-10-CM

## 2018-06-03 DIAGNOSIS — G43009 Migraine without aura, not intractable, without status migrainosus: Secondary | ICD-10-CM
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
## 2018-06-03 NOTE — Progress Notes (Signed)
I have reviewed and agreed above plan. 

## 2018-06-10 ENCOUNTER — Other Ambulatory Visit: Payer: Self-pay

## 2018-06-12 ENCOUNTER — Other Ambulatory Visit: Payer: Self-pay

## 2018-06-12 ENCOUNTER — Encounter: Payer: Self-pay | Admitting: Endocrinology

## 2018-06-12 ENCOUNTER — Other Ambulatory Visit (INDEPENDENT_AMBULATORY_CARE_PROVIDER_SITE_OTHER): Payer: Self-pay

## 2018-06-12 ENCOUNTER — Ambulatory Visit (INDEPENDENT_AMBULATORY_CARE_PROVIDER_SITE_OTHER): Payer: Medicare Other | Admitting: Endocrinology

## 2018-06-12 VITALS — BP 140/72 | HR 81 | Ht 62.5 in | Wt 180.2 lb

## 2018-06-12 DIAGNOSIS — E039 Hypothyroidism, unspecified: Secondary | ICD-10-CM | POA: Diagnosis not present

## 2018-06-12 DIAGNOSIS — G40209 Localization-related (focal) (partial) symptomatic epilepsy and epileptic syndromes with complex partial seizures, not intractable, without status epilepticus: Secondary | ICD-10-CM

## 2018-06-12 DIAGNOSIS — Z0289 Encounter for other administrative examinations: Secondary | ICD-10-CM

## 2018-06-12 DIAGNOSIS — E042 Nontoxic multinodular goiter: Secondary | ICD-10-CM | POA: Diagnosis not present

## 2018-06-12 DIAGNOSIS — G40919 Epilepsy, unspecified, intractable, without status epilepticus: Secondary | ICD-10-CM | POA: Diagnosis not present

## 2018-06-12 LAB — TSH: TSH: 1.33 u[IU]/mL (ref 0.35–4.50)

## 2018-06-12 LAB — T4, FREE: Free T4: 0.9 ng/dL (ref 0.60–1.60)

## 2018-06-12 NOTE — Progress Notes (Signed)
Subjective:    Patient ID: Erica Meyers, female    DOB: February 20, 1960, 58 y.o.   MRN: [id number]  HPI Pt returns for f/u of hyperthyroidism (due to multinodular goiter; dx'ed 2002; she had RAI in June, 2018; she started synthroid in June, 2019; bx showed SCANT FOLLICULAR EPITHELIUM PRESENT ([place] I); f/u US in 2019 showed Slight interval growth of nodule within left lobe , which now meets imaging criteria to recommend bx.  Despite reduction in size (presumably attributable to provided history of radioactive iodine ablative therapy), nodule #1 within the right lobe of the thyroid continues to meet imaging criteria to recommend bx--which in 2019 was cat 1).  Pt reports slight swelling throughout the body, and assoc arthralgias.   Past Medical History:  Diagnosis Date  . Anemia   . Depression   . Dyslipidemia   . Epilepsy ([place])   . Headache   . Hypertension   . Thyroid disease     Past Surgical History:  Procedure Laterality Date  . CESAREAN SECTION    . TONSILLECTOMY    . TUBAL LIGATION    . VEIN LIGATION AND STRIPPING Left     Social History   Socioeconomic History  . Marital status: Single    Spouse name: Not on file  . Number of children: Not on file  . Years of education: Not on file  . Highest education level: Not on file  Occupational History  . Not on file  Social Needs  . Financial resource strain: Not on file  . Food insecurity:    Worry: Not on file    Inability: Not on file  . Transportation needs:    Medical: Not on file    Non-medical: Not on file  Tobacco Use  . Smoking status: Never Smoker  . Smokeless tobacco: Never Used  Substance and Sexual Activity  . Alcohol use: No  . Drug use: No  . Sexual activity: Not on file  Lifestyle  . Physical activity:    Days per week: Not on file    Minutes per session: Not on file  . Stress: Not on file  Relationships  . Social connections:    Talks on phone: Not on file    Gets together: Not on  file    Attends religious service: Not on file    Active member of club or organization: Not on file    Attends meetings of clubs or organizations: Not on file    Relationship status: Not on file  . Intimate partner violence:    Fear of current or ex partner: Not on file    Emotionally abused: Not on file    Physically abused: Not on file    Forced sexual activity: Not on file  Other Topics Concern  . Not on file  Social History Narrative  . Not on file    Current Outpatient Medications on File Prior to Visit  Medication Sig Dispense Refill  . atorvastatin (LIPITOR) 20 MG tablet Take 20 mg by mouth daily.    . cholecalciferol (VITAMIN D) 1000 UNITS tablet Take 1,000 Units by mouth daily.    Marland Kitchen lamoTRIgine (LAMICTAL) 150 MG tablet TAKE 2 TABLETS BY MOUTH TWICE A DAY 360 tablet 2  . levothyroxine (SYNTHROID, LEVOTHROID) 75 MCG tablet Take 1 tablet (75 mcg total) by mouth daily before breakfast. 90 tablet 3  . lisinopril-hydrochlorothiazide (PRINZIDE,ZESTORETIC) 20-25 MG tablet TAKE 1 TABLET BY MOUTH ONCE A DAY (WILL PAY 3/14)  11  .  meloxicam (MOBIC) 7.5 MG tablet Take 7.5 mg by mouth daily.    . Multiple Vitamin (MULTIVITAMIN WITH MINERALS) TABS tablet Take 1 tablet by mouth daily.    . rizatriptan (MAXALT-MLT) 10 MG disintegrating tablet Take 1 tablet (10 mg total) by mouth as needed for migraine. May repeat in 2 hours if needed 12 tablet 11  . sertraline (ZOLOFT) 100 MG tablet Take 100 mg by mouth daily.  0  . topiramate (TOPAMAX) 100 MG tablet Take 1 tablet (100 mg total) by mouth 2 (two) times daily. 180 tablet 3   No current facility-administered medications on file prior to visit.     Allergies  Allergen Reactions  . Penicillins     Childhood, does not recall reaction.    Family History  Problem Relation Age of Onset  . Hypertension Mother   . Stroke Mother   . Thyroid disease Mother     BP 140/72 (BP Location: Left Arm, Patient Position: Sitting, Cuff Size: Large)    Pulse 81   Ht 5' 2.5" (1.588 m)   Wt 180 lb 3.2 oz (81.7 kg)   LMP 01/05/2016   SpO2 96%   BMI 32.43 kg/m   Review of Systems  She has intermitt neck pain and swelling.      Objective:   Physical Exam VITAL SIGNS:  See vs page GENERAL: no distress NECK: There is no palpable thyroid enlargement.  No thyroid nodule is palpable.  No palpable lymphadenopathy at the anterior neck.  Lab Results  Component Value Date   TSH 1.33 06/12/2018      Assessment & Plan:  MNG: clinically stable.  Hypothyroidism: well-replaced.  Please continue the same medications Epilepsy: in this setting, she needs to maintain euthyroidism on rx

## 2018-06-12 NOTE — Patient Instructions (Signed)
Blood tests are requested for you today.  We'll let you know about the results.   °Let's recheck the ultrasound.  you will receive a phone call, about a day and time for an appointment.   °Please come back for a follow-up appointment in 6 months.   °

## 2018-06-14 LAB — COMPREHENSIVE METABOLIC PANEL
ALT: 16 IU/L (ref 0–32)
AST: 20 IU/L (ref 0–40)
Albumin/Globulin Ratio: 1.7 (ref 1.2–2.2)
Albumin: 4.4 g/dL (ref 3.8–4.9)
Alkaline Phosphatase: 71 IU/L (ref 39–117)
BUN/Creatinine Ratio: 23 (ref 9–23)
BUN: 26 mg/dL — ABNORMAL HIGH (ref 6–24)
Bilirubin Total: <0.2 mg/dL (ref 0.0–1.2)
CO2: 20 mmol/L (ref 20–29)
Calcium: 9.5 mg/dL (ref 8.7–10.2)
Chloride: 101 mmol/L (ref 96–106)
Creatinine, Ser: 1.12 mg/dL — ABNORMAL HIGH (ref 0.57–1.00)
GFR calc Af Amer: 63 mL/min/{1.73_m2} (ref >59)
GFR calc non Af Amer: 55 mL/min/{1.73_m2} — ABNORMAL LOW (ref >59)
Globulin, Total: 2.6 g/dL (ref 1.5–4.5)
Glucose: 98 mg/dL (ref 65–99)
Potassium: 4.3 mmol/L (ref 3.5–5.2)
Sodium: 138 mmol/L (ref 134–144)
Total Protein: 7 g/dL (ref 6.0–8.5)

## 2018-06-14 LAB — CBC WITH DIFFERENTIAL/PLATELET
Basophils Absolute: 0.1 10*3/uL (ref 0.0–0.2)
Basos: 1 %
EOS (ABSOLUTE): 0.1 10*3/uL (ref 0.0–0.4)
Eos: 2 %
Hematocrit: 42.8 % (ref 34.0–46.6)
Hemoglobin: 14.4 g/dL (ref 11.1–15.9)
Immature Grans (Abs): 0 10*3/uL (ref 0.0–0.1)
Immature Granulocytes: 0 %
Lymphocytes Absolute: 1.8 10*3/uL (ref 0.7–3.1)
Lymphs: 34 %
MCH: 30.3 pg (ref 26.6–33.0)
MCHC: 33.6 g/dL (ref 31.5–35.7)
MCV: 90 fL (ref 79–97)
Monocytes Absolute: 0.5 10*3/uL (ref 0.1–0.9)
Monocytes: 9 %
Neutrophils Absolute: 2.8 10*3/uL (ref 1.4–7.0)
Neutrophils: 54 %
Platelets: 236 10*3/uL (ref 150–450)
RBC: 4.76 x10E6/uL (ref 3.77–5.28)
RDW: 12.8 % (ref 11.7–15.4)
WBC: 5.2 10*3/uL (ref 3.4–10.8)

## 2018-06-14 LAB — LAMOTRIGINE LEVEL: Lamotrigine Lvl: 3.6 ug/mL (ref 2.0–20.0)

## 2018-06-17 ENCOUNTER — Telehealth: Payer: Self-pay | Admitting: Neurology

## 2018-06-17 MED ORDER — LAMOTRIGINE 150 MG PO TABS: ORAL_TABLET | ORAL | 2 refills | Status: DC

## 2018-06-17 NOTE — Telephone Encounter (Signed)
I called this patient. Her lab work was unremarkable with the exception of mildly elevated creatinine at 1.12. I encouraged her ensure she stays well hydrated with water. Lamictal level is okay. Continue at current dose. I encouraged her to remain compliant with her medications. I sent in a refill of Lamictal.

## 2018-07-03 ENCOUNTER — Ambulatory Visit
Admission: RE | Admit: 2018-07-03 | Discharge: 2018-07-03 | Disposition: A | Discharge status: Home / Self Care | Payer: Medicare Other | Source: Ambulatory Visit | Attending: Endocrinology | Admitting: Endocrinology

## 2018-07-03 DIAGNOSIS — E042 Nontoxic multinodular goiter: Secondary | ICD-10-CM

## 2018-08-22 ENCOUNTER — Other Ambulatory Visit: Payer: Self-pay | Admitting: Endocrinology

## 2018-10-14 ENCOUNTER — Emergency Department (HOSPITAL_BASED_OUTPATIENT_CLINIC_OR_DEPARTMENT_OTHER): Payer: Medicare Other

## 2018-10-14 ENCOUNTER — Encounter (HOSPITAL_BASED_OUTPATIENT_CLINIC_OR_DEPARTMENT_OTHER): Payer: Self-pay

## 2018-10-14 ENCOUNTER — Emergency Department (HOSPITAL_BASED_OUTPATIENT_CLINIC_OR_DEPARTMENT_OTHER)
Admission: EM | Admit: 2018-10-14 | Discharge: 2018-10-14 | Disposition: A | Discharge status: Home / Self Care | Payer: Medicare Other | Attending: Emergency Medicine | Admitting: Emergency Medicine

## 2018-10-14 ENCOUNTER — Other Ambulatory Visit: Payer: Self-pay

## 2018-10-14 DIAGNOSIS — Y929 Unspecified place or not applicable: Secondary | ICD-10-CM | POA: Insufficient documentation

## 2018-10-14 DIAGNOSIS — W500XXA Accidental hit or strike by another person, initial encounter: Secondary | ICD-10-CM | POA: Diagnosis not present

## 2018-10-14 DIAGNOSIS — Y939 Activity, unspecified: Secondary | ICD-10-CM | POA: Diagnosis not present

## 2018-10-14 DIAGNOSIS — Z87891 Personal history of nicotine dependence: Secondary | ICD-10-CM | POA: Diagnosis not present

## 2018-10-14 DIAGNOSIS — E782 Mixed hyperlipidemia: Secondary | ICD-10-CM | POA: Diagnosis not present

## 2018-10-14 DIAGNOSIS — S3991XA Unspecified injury of abdomen, initial encounter: Secondary | ICD-10-CM

## 2018-10-14 DIAGNOSIS — Y999 Unspecified external cause status: Secondary | ICD-10-CM | POA: Diagnosis not present

## 2018-10-14 DIAGNOSIS — Z880 Allergy status to penicillin: Secondary | ICD-10-CM | POA: Insufficient documentation

## 2018-10-14 DIAGNOSIS — R319 Hematuria, unspecified: Secondary | ICD-10-CM | POA: Diagnosis not present

## 2018-10-14 DIAGNOSIS — I10 Essential (primary) hypertension: Secondary | ICD-10-CM | POA: Diagnosis not present

## 2018-10-14 DIAGNOSIS — S301XXA Contusion of abdominal wall, initial encounter: Secondary | ICD-10-CM | POA: Diagnosis not present

## 2018-10-14 LAB — URINALYSIS, ROUTINE W REFLEX MICROSCOPIC
Bilirubin Urine: NEGATIVE
Glucose, UA: NEGATIVE mg/dL
Hgb urine dipstick: NEGATIVE
Ketones, ur: NEGATIVE mg/dL
Leukocytes,Ua: NEGATIVE
Nitrite: NEGATIVE
Protein, ur: NEGATIVE mg/dL
Specific Gravity, Urine: 1.025 (ref 1.005–1.030)
pH: 5.5 (ref 5.0–8.0)

## 2018-10-14 LAB — CBC WITH DIFFERENTIAL/PLATELET
Abs Immature Granulocytes: 0.01 10*3/uL (ref 0.00–0.07)
Basophils Absolute: 0 10*3/uL (ref 0.0–0.1)
Basophils Relative: 1 %
Eosinophils Absolute: 0.1 10*3/uL (ref 0.0–0.5)
Eosinophils Relative: 1 %
HCT: 46.3 % — ABNORMAL HIGH (ref 36.0–46.0)
Hemoglobin: 14.5 g/dL (ref 12.0–15.0)
Immature Granulocytes: 0 %
Lymphocytes Relative: 32 %
Lymphs Abs: 2.2 10*3/uL (ref 0.7–4.0)
MCH: 29.4 pg (ref 26.0–34.0)
MCHC: 31.3 g/dL (ref 30.0–36.0)
MCV: 93.7 fL (ref 80.0–100.0)
Monocytes Absolute: 0.7 10*3/uL (ref 0.1–1.0)
Monocytes Relative: 9 %
Neutro Abs: 3.9 10*3/uL (ref 1.7–7.7)
Neutrophils Relative %: 57 %
Platelets: 236 10*3/uL (ref 150–400)
RBC: 4.94 MIL/uL (ref 3.87–5.11)
RDW: 13.7 % (ref 11.5–15.5)
WBC: 6.9 10*3/uL (ref 4.0–10.5)
nRBC: 0 % (ref 0.0–0.2)

## 2018-10-14 LAB — COMPREHENSIVE METABOLIC PANEL
ALT: 20 U/L (ref 0–44)
AST: 23 U/L (ref 15–41)
Albumin: 4.2 g/dL (ref 3.5–5.0)
Alkaline Phosphatase: 65 U/L (ref 38–126)
Anion gap: 11 (ref 5–15)
BUN: 21 mg/dL — ABNORMAL HIGH (ref 6–20)
CO2: 25 mmol/L (ref 22–32)
Calcium: 9.8 mg/dL (ref 8.9–10.3)
Chloride: 98 mmol/L (ref 98–111)
Creatinine, Ser: 1.01 mg/dL — ABNORMAL HIGH (ref 0.44–1.00)
GFR calc Af Amer: >60 mL/min (ref >60)
GFR calc non Af Amer: >60 mL/min (ref >60)
Glucose, Bld: 104 mg/dL — ABNORMAL HIGH (ref 70–99)
Potassium: 3.5 mmol/L (ref 3.5–5.1)
Sodium: 134 mmol/L — ABNORMAL LOW (ref 135–145)
Total Bilirubin: 0.4 mg/dL (ref 0.3–1.2)
Total Protein: 7.8 g/dL (ref 6.5–8.1)

## 2018-10-14 LAB — LIPASE, BLOOD: Lipase: 32 U/L (ref 11–51)

## 2018-10-14 MED ORDER — IOHEXOL 300 MG/ML  SOLN
100.0000 mL | Freq: Once | INTRAMUSCULAR | Status: AC | PRN
  Administered 2018-10-14: 100 mL via INTRAVENOUS
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
## 2018-10-14 NOTE — Discharge Instructions (Addendum)
You will need to follow up with your regular doctor for further evaluation of having blood in your urine.    Please follow up within 5-7 days for re-evaluation of your symptoms.  Please return to the emergency department for any new or worsening symptoms.
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
## 2018-11-27 ENCOUNTER — Other Ambulatory Visit: Payer: Self-pay | Admitting: *Deleted

## 2018-11-27 MED ORDER — TOPIRAMATE 100 MG PO TABS: 100.0000 mg | ORAL_TABLET | Freq: Two times a day (BID) | ORAL | 3 refills | Status: DC

## 2018-12-04 ENCOUNTER — Ambulatory Visit: Payer: Medicare Other | Admitting: Physical Therapy

## 2018-12-17 ENCOUNTER — Ambulatory Visit: Payer: Medicare Other | Admitting: Physical Therapy

## 2018-12-30 ENCOUNTER — Encounter: Payer: Self-pay | Admitting: Physical Therapy

## 2018-12-30 ENCOUNTER — Ambulatory Visit: Payer: Medicare Other | Attending: Family Medicine | Admitting: Physical Therapy

## 2018-12-30 ENCOUNTER — Other Ambulatory Visit: Payer: Self-pay

## 2018-12-30 DIAGNOSIS — M545 Low back pain, unspecified: Secondary | ICD-10-CM

## 2018-12-30 DIAGNOSIS — M25551 Pain in right hip: Secondary | ICD-10-CM | POA: Insufficient documentation

## 2018-12-30 DIAGNOSIS — M6281 Muscle weakness (generalized): Secondary | ICD-10-CM | POA: Insufficient documentation

## 2018-12-30 NOTE — Therapy (Signed)
[facility] [address] [place], [place], [address] Phone: [phone]   Fax:  [phone]  Physical Therapy Evaluation  Patient Details  Name: Erica Meyers MRN: [id number] Date of Birth: 1960/04/20 Referring Provider (PT): Dibas Dorthy Cooler, MD   Encounter Date: 12/30/2018  PT End of Session - 12/30/18 1008    Visit Number  1    Date for PT Re-Evaluation  02/27/19    Authorization Time Period  12/30/18 to 02/27/19    PT Start Time  0930    PT Stop Time  1011    PT Time Calculation (min)  41 min    Activity Tolerance  No increased pain;Patient tolerated treatment well    Behavior During Therapy  [facility] for tasks assessed/performed       Past Medical History:  Diagnosis Date  . Anemia   . Depression   . Dyslipidemia   . Epilepsy ([place])   . Headache   . Hypertension   . Thyroid disease     Past Surgical History:  Procedure Laterality Date  . CESAREAN SECTION    . TONSILLECTOMY    . TUBAL LIGATION    . VEIN LIGATION AND STRIPPING Left     There were no vitals filed for this visit.   Subjective Assessment - 12/30/18 0935    Subjective  Pt states that she had a seizure and fell almost 2 years ago. She hyper-extended her hip and has been having pain/discomfort since then. She feels that she waddles when she walks and is now having low back discomfort. She will have occasional back spasms after she is walking and doing alot of activity.    Pertinent History  epilepsy    Limitations  Walking    How long can you walk comfortably?  can fluctuate    Patient Stated Goals  decrease pain with    Currently in Pain?  No/denies    Pain Orientation  Right   Rt hip/groin; low back across Lt/Rt   Pain Descriptors / Indicators  Spasm;Tightness    Aggravating Factors   Rt hip: getting up after sitting for too long; walking both hip and back    Pain Relieving Factors  squatting to try to loosen the hip up         [facility]  PT Assessment - 12/30/18 0001      Assessment   Medical Diagnosis  low back pain, Rt hip pain     Referring Provider (PT)  Dibas Koirala, MD    Onset Date/Surgical Date  --   unsure, fall 2 years ago    Prior Therapy  none       Precautions   Precautions  None      Restrictions   Weight Bearing Restrictions  No      Balance Screen   Has the patient fallen in the past 6 months  No    Has the patient had a decrease in activity level because of a fear of falling?   No    Is the patient reluctant to leave their home because of a fear of falling?   No      Home Environment   Living Environment  Private residence      Prior Function   Level of Independence  Independent    Leisure  was walking 30 min/day but has stopped this       Cognition   Overall Cognitive Status  Within Functional Limits for tasks  assessed      Sensation   Additional Comments  denies numbness/tingling       ROM / Strength   AROM / PROM / Strength  AROM;PROM;Strength      AROM   Overall AROM Comments  lumbar: Lt rotation (pain Rt hip Lt back), Rt rotation: (+) pain Rt side back only      PROM   Overall PROM Comments  Prone: hip IR limited 20 deg, ER 35 deg bilaterally, hip flexion (+) pain Lt/Rt at 95 deg       Strength   Strength Assessment Site  Hip    Right/Left Hip  Right;Left    Right Hip Flexion  4/5    Right Hip Extension  3+/5    Right Hip External Rotation   4/5    Right Hip Internal Rotation  4/5    Right Hip ABduction  3/5   (+) pain   Left Hip Flexion  4/5    Left Hip Extension  3+/5    Left Hip External Rotation  4/5    Left Hip Internal Rotation  4/5    Left Hip ABduction  3/5      Palpation   Spinal mobility  tenderness L5, L4     Palpation comment  tenderness in Rt and Lt glute medius; lumbar paraspinals      Special Tests   Other special tests  FAIR: (+) Rt and Lt       Transfers   Five time sit to stand comments   8 sec, trunk lean Lt       Ambulation/Gait   Pre-Gait  Activities  ascending/descending steps in clinic with (+) trendelenburg worse on Rt       High Level Balance   High Level Balance Comments  SLS: 3 sec each side                Objective measurements completed on examination: See above findings.                PT Short Term Goals - 12/30/18 1358      PT SHORT TERM GOAL #1   Title  Pt will demo independence with her initial HEP to increase flexibility and strength.    Time  4    Period  Weeks    Status  New    Target Date  01/27/19      PT SHORT TERM GOAL #2   Title  Pt will have greater than 30 deg of passive Lt and Rt hip internal rotation which will improve her mechanics with daily activity.    Time  4    Period  Weeks    Status  New        PT Long Term Goals - 12/30/18 1359      PT LONG TERM GOAL #1   Title  Pt will have greater than 4/5 MMT strength of BLEs which will improve her efficiency with daily activity and walking.    Time  8    Period  Weeks    Status  New    Target Date  02/27/19      PT LONG TERM GOAL #2   Title  Pt will have improved functional LE strength evident by her ability to ascend and descend clinic steps x3 trials without excessive trendelenburg deviation.    Time  8    Period  Weeks    Status  New      PT LONG  TERM GOAL #3   Title  Pt will report atleast 50% improvement in her hip/low back pain from the start of PT.    Time  8    Period  Weeks    Status  New      PT LONG TERM GOAL #4   Title  Pt will be able to complete 30 minute walk atleast 3 days a week without needing to stop secondary to hip/back pain.    Time  8    Period  Weeks    Status  New             Plan - 12/30/18 1152    Clinical Impression Statement  Pt is a 58 y.o F referred to OPPT with complaints of low back pain and Rt greater than Lt hip pain with daily activity. Pt's Rt hip has been bothering her for nearly 2 years following a fall and just recently she has noticed low back spasm with  walking and other prolonged activity. Pt has bilateral hip weakness, tenderness in the central low back, as well as lack of hip flexibility and altered mechanics with walking/sit to stand likely contributing to her recent onset of intermittent low back pain. Pt has hip pain with end range passive flexion and internal rotation and there is tenderness to palpation of the gluteals more on the Rt than the Lt. Pt would benefit from skilled PT to improve hip strength/flexibility and decrease muscle spasm in the lumbar spine, and improve body mechanics which will allow her to resume her regular exercise and other daily activity without limitation.    Personal Factors and Comorbidities  Time since onset of injury/illness/exacerbation    Examination-Activity Limitations  Locomotion Level    Examination-Participation Restrictions  Other    Stability/Clinical Decision Making  Stable/Uncomplicated    Clinical Decision Making  Low    Rehab Potential  Good    PT Frequency  2x / week    PT Duration  8 weeks    PT Treatment/Interventions  ADLs/Self Care Home Management;Electrical Stimulation;Moist Heat;Traction;Therapeutic activities;Therapeutic exercise;Balance training;Neuromuscular re-education;Patient/family education;Manual techniques;Passive range of motion;Dry needling;Taping;Spinal Manipulations;Joint Manipulations    PT Next Visit Plan  hip flexibility; manual treatment to gluteals/joint mobilization; progress hip strength and trunk strength    PT Home Exercise Plan  next visit    Consulted and Agree with Plan of Care  Patient       Patient will benefit from skilled therapeutic intervention in order to improve the following deficits and impairments:  Decreased activity tolerance, Decreased strength, Impaired flexibility, Pain, Decreased range of motion, Decreased balance, Increased muscle spasms  Visit Diagnosis: Low back pain, unspecified back pain laterality, unspecified chronicity, unspecified whether  sciatica present  Pain in right hip  Muscle weakness (generalized)     Problem List Patient Active Problem List   Diagnosis Date Noted  . Weight gain 12/10/2017  . Snoring 10/31/2017  . Somnolence, daytime 10/31/2017  . Hypothyroidism 08/28/2017  . Multinodular goiter 07/05/2017  . Therapeutic drug monitoring 10/31/2016  . Neck pain 08/08/2016  . Dyslipidemia   . Depression   . Headache   . Anemia   . Anxiety 10/31/2015  . Migraine without aura and without status migrainosus, not intractable 11/15/2014  . 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 Complex partial seizure ([place]) 11/18/2012  . Migraine without aura 11/18/2012  . Unspecified essential hypertension 11/18/2012  . Drowsiness 11/18/2012   2:47 PM,12/30/18 Sherol Dade PT, DPT [facility] [place] [facility] [address] [place], [place], [address] Phone: [phone]   Fax:  [phone]  Name: Erica Meyers MRN: [id number] Date of Birth: 1960-05-04

## 2019-01-06 ENCOUNTER — Encounter: Payer: Medicare Other | Admitting: Physical Therapy

## 2019-01-08 ENCOUNTER — Encounter: Payer: Self-pay | Admitting: Physical Therapy

## 2019-01-08 ENCOUNTER — Ambulatory Visit: Payer: Medicare Other | Admitting: Physical Therapy

## 2019-01-08 ENCOUNTER — Other Ambulatory Visit: Payer: Self-pay

## 2019-01-08 DIAGNOSIS — M545 Low back pain, unspecified: Secondary | ICD-10-CM

## 2019-01-08 DIAGNOSIS — M6281 Muscle weakness (generalized): Secondary | ICD-10-CM

## 2019-01-08 DIAGNOSIS — M25551 Pain in right hip: Secondary | ICD-10-CM

## 2019-01-08 NOTE — Patient Instructions (Signed)
Access Code: [email or web address]  URL: [email or web address]  Date: 01/08/2019  Prepared by: Sherol Dade   Exercises  Supine Figure 4 Piriformis Stretch - 2 reps - 30 hold - 2x daily - 7x weekly  Supine Hip Adductor Stretch - 5 reps - 10 hold - 2x daily - 7x weekly  Hooklying Clamshell with Resistance - 10 reps - 2 sets - 1x daily - 7x weekly  Supine Bridge - 10 reps - 3 sets - 1x daily - 7x weekly    [facility] [address] [place], [place] [address] Phone # [phone] Fax [phone]

## 2019-01-08 NOTE — Therapy (Signed)
[facility] [address], [place], [place], [address] Phone: [phone]   Fax:  [phone]  Physical Therapy Treatment  Patient Details  Name: Erica Meyers MRN: [id number] Date of Birth: July 11, 1960 Referring Provider (PT): Dibas Dorthy Cooler, MD   Encounter Date: 01/08/2019  PT End of Session - 01/08/19 0941    Visit Number  2    Date for PT Re-Evaluation  02/27/19    Authorization Time Period  12/30/18 to 02/27/19    PT Start Time  0939   pt arrived late   PT Stop Time  1012    PT Time Calculation (min)  33 min    Activity Tolerance  No increased pain;Patient tolerated treatment well    Behavior During Therapy  [facility] for tasks assessed/performed       Past Medical History:  Diagnosis Date  . Anemia   . Depression   . Dyslipidemia   . Epilepsy ([place])   . Headache   . Hypertension   . Thyroid disease     Past Surgical History:  Procedure Laterality Date  . CESAREAN SECTION    . TONSILLECTOMY    . TUBAL LIGATION    . VEIN LIGATION AND STRIPPING Left     There were no vitals filed for this visit.  Subjective Assessment - 01/08/19 0940    Subjective  Pt states that things are going ok. She is having ankle swelling and joint discomfort this morning. No specific mention of hip pain.    Pertinent History  epilepsy    Limitations  Walking    How long can you walk comfortably?  can fluctuate    Patient Stated Goals  decrease pain with    Currently in Pain?  No/denies                 [facility] Adult PT Treatment/Exercise - 01/08/19 0001      Exercises   Exercises  Knee/Hip      Knee/Hip Exercises: Stretches   Other Knee/Hip Stretches  hip addictor butterfly stretch 3x10 sec       Knee/Hip Exercises: Aerobic   Nustep  Seat 7, L3 x5 min PT present to discuss HEP      Knee/Hip Exercises: Standing   Heel Raises  Both;1 set;20 reps      Knee/Hip Exercises: Supine   Bridges  Both    Other Supine  Knee/Hip Exercises  BLE clam with red TB 2x10 reps       Knee/Hip Exercises: Sidelying   Hip ABduction  Right;Left;Strengthening;1 set;10 reps             PT Education - 01/08/19 0946    Education Details  implemented HEP; technique with therex    Person(s) Educated  Patient    Methods  Explanation;Handout;Verbal cues    Comprehension  Verbalized understanding;Returned demonstration       PT Short Term Goals - 12/30/18 1358      PT SHORT TERM GOAL #1   Title  Pt will demo independence with her initial HEP to increase flexibility and strength.    Time  4    Period  Weeks    Status  New    Target Date  01/27/19      PT SHORT TERM GOAL #2   Title  Pt will have greater than 30 deg of passive Lt and Rt hip internal rotation which will improve her mechanics with daily activity.    Time  4  Period  Weeks    Status  New        PT Long Term Goals - 12/30/18 1359      PT LONG TERM GOAL #1   Title  Pt will have greater than 4/5 MMT strength of BLEs which will improve her efficiency with daily activity and walking.    Time  8    Period  Weeks    Status  New    Target Date  02/27/19      PT LONG TERM GOAL #2   Title  Pt will have improved functional LE strength evident by her ability to ascend and descend clinic steps x3 trials without excessive trendelenburg deviation.    Time  8    Period  Weeks    Status  New      PT LONG TERM GOAL #3   Title  Pt will report atleast 50% improvement in her hip/low back pain from the start of PT.    Time  8    Period  Weeks    Status  New      PT LONG TERM GOAL #4   Title  Pt will be able to complete 30 minute walk atleast 3 days a week without needing to stop secondary to hip/back pain.    Time  8    Period  Weeks    Status  New            Plan - 01/08/19 1010    Clinical Impression Statement  Pt arrived late to her appointment today, but she denied any hip pain upon arrival. Session focused on implementing her HEP to  promote hip flexibility and strength. Pt required PT verbal cuing to understanding proper technique and adjust for hip comfort specifically with piriformis strech. Pt denied pain end of session and had good understanding of her HEP. Will continue with current POC moving forward.    Personal Factors and Comorbidities  Time since onset of injury/illness/exacerbation    Examination-Activity Limitations  Locomotion Level    Examination-Participation Restrictions  Other    Stability/Clinical Decision Making  Stable/Uncomplicated    Rehab Potential  Good    PT Frequency  2x / week    PT Duration  8 weeks    PT Treatment/Interventions  ADLs/Self Care Home Management;Electrical Stimulation;Moist Heat;Traction;Therapeutic activities;Therapeutic exercise;Balance training;Neuromuscular re-education;Patient/family education;Manual techniques;Passive range of motion;Dry needling;Taping;Spinal Manipulations;Joint Manipulations    PT Next Visit Plan  hip flexibility; manual treatment to gluteals/joint mobilization as needed; progress hip strength and trunk strength    PT Home Exercise Plan  next visit    Consulted and Agree with Plan of Care  Patient       Patient will benefit from skilled therapeutic intervention in order to improve the following deficits and impairments:  Decreased activity tolerance, Decreased strength, Impaired flexibility, Pain, Decreased range of motion, Decreased balance, Increased muscle spasms  Visit Diagnosis: Low back pain, unspecified back pain laterality, unspecified chronicity, unspecified whether sciatica present  Pain in right hip  Muscle weakness (generalized)     Problem List Patient Active Problem List   Diagnosis Date Noted  . Weight gain 12/10/2017  . Snoring 10/31/2017  . Somnolence, daytime 10/31/2017  . Hypothyroidism 08/28/2017  . Multinodular goiter 07/05/2017  . Therapeutic drug monitoring 10/31/2016  . Neck pain 08/08/2016  . Dyslipidemia   .  Depression   . Headache   . Anemia   . Anxiety 10/31/2015  . Migraine without aura and without status migrainosus,  not intractable 11/15/2014  . 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 Complex partial seizure ([place]) 11/18/2012  . Migraine without aura 11/18/2012  . Unspecified essential hypertension 11/18/2012  . Drowsiness 11/18/2012     10:11 AM,01/08/19 Sherol Dade PT, DPT [place] [facility] [place] [facility] [address] [place], [place], [address] Phone: [phone]   Fax:  [phone]  Name: MALLORY DOETSCH MRN: [id number] Date of Birth: [birth year]

## 2019-01-13 ENCOUNTER — Other Ambulatory Visit: Payer: Self-pay

## 2019-01-13 ENCOUNTER — Ambulatory Visit: Payer: Medicare Other | Admitting: Physical Therapy

## 2019-01-13 ENCOUNTER — Encounter: Payer: Self-pay | Admitting: Physical Therapy

## 2019-01-13 DIAGNOSIS — M545 Low back pain, unspecified: Secondary | ICD-10-CM

## 2019-01-13 DIAGNOSIS — M25551 Pain in right hip: Secondary | ICD-10-CM

## 2019-01-13 DIAGNOSIS — M6281 Muscle weakness (generalized): Secondary | ICD-10-CM

## 2019-01-13 NOTE — Patient Instructions (Signed)
Access Code: [email or web address]  URL: [email or web address]  Date: 01/13/2019  Prepared by: Ruben Im   Exercises Supine Figure 4 Piriformis Stretch - 2 reps - 30 hold - 2x daily - 7x weekly Supine Hip Adductor Stretch - 5 reps - 10 hold - 2x daily - 7x weekly Hooklying Clamshell with Resistance - 10 reps - 2 sets - 1x daily - 7x weekly Supine Bridge - 10 reps - 3 sets - 1x daily - 7x weekly Sit to Stand - 10 reps - 1 sets - 1x daily - 7x weekly Hip Flexor Stretch on Step - 5 reps - 1 sets - 1x daily - 7x weekly Forward Step Up - 5 reps - 1 sets - 1x daily - 7x weekly

## 2019-01-13 NOTE — Therapy (Signed)
[facility] [address] [place], [place], [address] Phone: [phone]   Fax:  [phone]  Physical Therapy Treatment  Patient Details  Name: Erica Meyers MRN: [id number] Date of Birth: 06-25-1960 Referring Provider (PT): Dibas Dorthy Cooler, MD   Encounter Date: 01/13/2019  PT End of Session - 01/13/19 1025    Visit Number  3    Date for PT Re-Evaluation  02/27/19    Authorization Time Period  12/30/18 to 02/27/19    PT Start Time  0934    PT Stop Time  1017    PT Time Calculation (min)  43 min    Activity Tolerance  Patient tolerated treatment well       Past Medical History:  Diagnosis Date  . Anemia   . Depression   . Dyslipidemia   . Epilepsy ([place])   . Headache   . Hypertension   . Thyroid disease     Past Surgical History:  Procedure Laterality Date  . CESAREAN SECTION    . TONSILLECTOMY    . TUBAL LIGATION    . VEIN LIGATION AND STRIPPING Left     There were no vitals filed for this visit.  Subjective Assessment - 01/13/19 0936    Subjective  I just found out on Facebook that my childhood friend passed away from Covid.  I did the exercises.  At first it was painful but gained flexibility.  I do think the exercises help though.  Yesterday it started catching again for no reason.  My hips and knees pop all the time.    Pertinent History  epilepsy    Currently in Pain?  Yes    Pain Score  5     Pain Location  Hip    Pain Orientation  Right                       [facility] Adult PT Treatment/Exercise - 01/13/19 0001      Lumbar Exercises: Stretches   Lower Trunk Rotation  5 reps    Lower Trunk Rotation Limitations  with green ball     Piriformis Stretch  Right;Left;5 reps    Piriformis Stretch Limitations  with green ball       Knee/Hip Exercises: Stretches   Other Knee/Hip Stretches  hip addictor butterfly stretch discontinued secondary to inc pain     Other Knee/Hip Stretches   2nd step hip flexor stretch 5x right/left       Knee/Hip Exercises: Standing   Forward Step Up  Right;Left;5 reps    Other Standing Knee Exercises  tandem stand with other foot prop with UE green band pull downs diagonal 15x each side       Knee/Hip Exercises: Seated   Sit to Sand  2 sets;5 reps   black foam on mat table      Knee/Hip Exercises: Supine   Bridges  Both;10 reps      Knee/Hip Exercises: Sidelying   Hip ABduction  Right;Left;Strengthening;1 set;10 reps             PT Education - 01/13/19 1013    Education Details  Access Code: [email or web address] sit to stand, stair step hip flexor stretch; step ups    Person(s) Educated  Patient    Methods  Explanation;Demonstration;Handout    Comprehension  Returned demonstration;Verbalized understanding       PT Short Term Goals - 12/30/18 1358      PT SHORT TERM  GOAL #1   Title  Pt will demo independence with her initial HEP to increase flexibility and strength.    Time  4    Period  Weeks    Status  New    Target Date  01/27/19      PT SHORT TERM GOAL #2   Title  Pt will have greater than 30 deg of passive Lt and Rt hip internal rotation which will improve her mechanics with daily activity.    Time  4    Period  Weeks    Status  New        PT Long Term Goals - 12/30/18 1359      PT LONG TERM GOAL #1   Title  Pt will have greater than 4/5 MMT strength of BLEs which will improve her efficiency with daily activity and walking.    Time  8    Period  Weeks    Status  New    Target Date  02/27/19      PT LONG TERM GOAL #2   Title  Pt will have improved functional LE strength evident by her ability to ascend and descend clinic steps x3 trials without excessive trendelenburg deviation.    Time  8    Period  Weeks    Status  New      PT LONG TERM GOAL #3   Title  Pt will report atleast 50% improvement in her hip/low back pain from the start of PT.    Time  8    Period  Weeks    Status  New      PT LONG TERM GOAL  #4   Title  Pt will be able to complete 30 minute walk atleast 3 days a week without needing to stop secondary to hip/back pain.    Time  8    Period  Weeks    Status  New            Plan - 01/13/19 1021    Clinical Impression Statement  Verbal and tactile cues given to correct compensatory hip flexion with sidelying hip abduction.  Modified exercises according to pain, limited hip external rotation today.  She reports multi joint popping and discussed importance of strengthening ex for stabilization.  Therapist closely monitoring response with all.    Personal Factors and Comorbidities  Time since onset of injury/illness/exacerbation    Rehab Potential  Good    PT Frequency  2x / week    PT Duration  8 weeks    PT Treatment/Interventions  ADLs/Self Care Home Management;Electrical Stimulation;Moist Heat;Traction;Therapeutic activities;Therapeutic exercise;Balance training;Neuromuscular re-education;Patient/family education;Manual techniques;Passive range of motion;Dry needling;Taping;Spinal Manipulations;Joint Manipulations    PT Next Visit Plan  hip flexibility; manual treatment to gluteals/joint mobilization as needed; progress hip strength and trunk strength    PT Home Exercise Plan  Access Code: [email or web address]       Patient will benefit from skilled therapeutic intervention in order to improve the following deficits and impairments:  Decreased activity tolerance, Decreased strength, Impaired flexibility, Pain, Decreased range of motion, Decreased balance, Increased muscle spasms  Visit Diagnosis: Low back pain, unspecified back pain laterality, unspecified chronicity, unspecified whether sciatica present  Pain in right hip  Muscle weakness (generalized)     Problem List Patient Active Problem List   Diagnosis Date Noted  . Weight gain 12/10/2017  . Snoring 10/31/2017  . Somnolence, daytime 10/31/2017  . Hypothyroidism 08/28/2017  . Multinodular goiter 07/05/2017  .  Therapeutic  drug monitoring 10/31/2016  . Neck pain 08/08/2016  . Dyslipidemia   . Depression   . Headache   . Anemia   . Anxiety 10/31/2015  . Migraine without aura and without status migrainosus, not intractable 11/15/2014  . 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 Complex partial seizure ([place]) 11/18/2012  . Migraine without aura 11/18/2012  . Unspecified essential hypertension 11/18/2012  . Drowsiness 11/18/2012   Ruben Im, PT 01/13/19 5:49 PM Phone: [phone] Fax: [phone] Alvera Singh 01/13/2019, 5:49 PM  [place] [address], [place], [place], [address] Phone: [phone]   Fax:  [phone]  Name: MADELEYN SCHNURBUSCH MRN: [id number] Date of Birth: 10-10-1960

## 2019-01-15 ENCOUNTER — Telehealth: Payer: Self-pay | Admitting: Physical Therapy

## 2019-01-15 ENCOUNTER — Ambulatory Visit: Payer: Medicare Other | Admitting: Physical Therapy

## 2019-01-15 NOTE — Telephone Encounter (Signed)
No show. PT left voicemail for pt to return call at hear earliest convenience.  9:49 AM,01/15/19 [place], [place] at [place]

## 2019-01-20 ENCOUNTER — Encounter: Payer: Self-pay | Admitting: Physical Therapy

## 2019-01-20 ENCOUNTER — Other Ambulatory Visit: Payer: Self-pay

## 2019-01-20 ENCOUNTER — Ambulatory Visit: Payer: Medicare Other | Admitting: Physical Therapy

## 2019-01-20 DIAGNOSIS — M545 Low back pain, unspecified: Secondary | ICD-10-CM

## 2019-01-20 DIAGNOSIS — M25551 Pain in right hip: Secondary | ICD-10-CM

## 2019-01-20 DIAGNOSIS — M6281 Muscle weakness (generalized): Secondary | ICD-10-CM
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
## 2019-01-22 ENCOUNTER — Ambulatory Visit: Payer: Medicare Other | Admitting: Physical Therapy

## 2019-01-27 ENCOUNTER — Ambulatory Visit: Payer: Medicare Other | Admitting: Physical Therapy

## 2019-01-29 ENCOUNTER — Ambulatory Visit: Payer: Medicare Other | Admitting: Physical Therapy

## 2019-02-03 ENCOUNTER — Encounter: Payer: Self-pay | Admitting: Physical Therapy

## 2019-02-03 ENCOUNTER — Ambulatory Visit: Payer: Medicare Other | Attending: Family Medicine | Admitting: Physical Therapy

## 2019-02-03 ENCOUNTER — Other Ambulatory Visit: Payer: Self-pay

## 2019-02-03 DIAGNOSIS — M25551 Pain in right hip: Secondary | ICD-10-CM | POA: Diagnosis present

## 2019-02-03 DIAGNOSIS — M6281 Muscle weakness (generalized): Secondary | ICD-10-CM | POA: Insufficient documentation

## 2019-02-03 DIAGNOSIS — M545 Low back pain, unspecified: Secondary | ICD-10-CM

## 2019-02-03 NOTE — Therapy (Signed)
[facility] [address], [place], [place], [address] Phone: [phone]   Fax:  [phone]  Physical Therapy Treatment  Patient Details  Name: Erica Meyers MRN: [id number] Date of Birth: 1960-04-12 Referring Provider (PT): Dibas Dorthy Cooler, MD   Encounter Date: 02/03/2019  PT End of Session - 02/03/19 1014    Visit Number  5    Date for PT Re-Evaluation  02/27/19    Authorization Time Period  12/30/18 to 02/27/19    PT Start Time  0937    PT Stop Time  1015    PT Time Calculation (min)  38 min    Activity Tolerance  Patient tolerated treatment well;No increased pain    Behavior During Therapy  WFL for tasks assessed/performed       Past Medical History:  Diagnosis Date  . Anemia   . Depression   . Dyslipidemia   . Epilepsy ([place])   . Headache   . Hypertension   . Thyroid disease     Past Surgical History:  Procedure Laterality Date  . CESAREAN SECTION    . TONSILLECTOMY    . TUBAL LIGATION    . VEIN LIGATION AND STRIPPING Left     There were no vitals filed for this visit.  Subjective Assessment - 02/03/19 0941    Subjective  Pt states that her pain frequency is improving, but her pain will still catch her throughout the day periodically.    Pertinent History  epilepsy    Currently in Pain?  No/denies                       [facility] Adult PT Treatment/Exercise - 02/03/19 0001      Knee/Hip Exercises: Machines for Strengthening   Total Gym Leg Press  seat 7 #70 2x15 reps, PT cuing to avoid knee valgus       Knee/Hip Exercises: Standing   Heel Raises  Both;1 set;20 reps    Hip Abduction  Left;Right;AROM;2 sets;5 reps    Abduction Limitations  standing on foam pad     Hip Extension  Left;Right;AROM;1 set;10 reps    Extension Limitations  standing on foam     Other Standing Knee Exercises  hip slider abduction and extension x10 reps each, red TB around feet     Other Standing Knee  Exercises  Rt and Lt hip hike/lower standing on 6" box, x10 reps PT cuing to increase gltueal activation             PT Education - 02/03/19 1029    Education Details  technique with therex    Person(s) Educated  Patient    Methods  Explanation;Verbal cues    Comprehension  Verbalized understanding;Returned demonstration       PT Short Term Goals - 01/20/19 0933      PT SHORT TERM GOAL #1   Title  Pt will demo independence with her initial HEP to increase flexibility and strength.    Time  4    Period  Weeks    Status  Achieved    Target Date  01/27/19      PT SHORT TERM GOAL #2   Title  Pt will have greater than 30 deg of passive Lt and Rt hip internal rotation which will improve her mechanics with daily activity.    Time  4    Period  Weeks    Status  New  PT Long Term Goals - 12/30/18 1359      PT LONG TERM GOAL #1   Title  Pt will have greater than 4/5 MMT strength of BLEs which will improve her efficiency with daily activity and walking.    Time  8    Period  Weeks    Status  New    Target Date  02/27/19      PT LONG TERM GOAL #2   Title  Pt will have improved functional LE strength evident by her ability to ascend and descend clinic steps x3 trials without excessive trendelenburg deviation.    Time  8    Period  Weeks    Status  New      PT LONG TERM GOAL #3   Title  Pt will report atleast 50% improvement in her hip/low back pain from the start of PT.    Time  8    Period  Weeks    Status  New      PT LONG TERM GOAL #4   Title  Pt will be able to complete 30 minute walk atleast 3 days a week without needing to stop secondary to hip/back pain.    Time  8    Period  Weeks    Status  New            Plan - 02/03/19 1001    Clinical Impression Statement  Pt continues to complete her HEP without any issues. Her pain frequency has decreased since starting PT, however she still has intermittent catching throughout the day. Pt was able to  complete standing exercises for hip strength and stability, requiring intermittent PT verbal cuing and adjustments to her technique for improved comfort. Pt had difficulty maintaining balance with single leg stance on foam pad, but there was no LOB. Pt denied pain end of session. Will continue with current POC.    Personal Factors and Comorbidities  Time since onset of injury/illness/exacerbation    Rehab Potential  Good    PT Frequency  2x / week    PT Duration  8 weeks    PT Treatment/Interventions  ADLs/Self Care Home Management;Electrical Stimulation;Moist Heat;Traction;Therapeutic activities;Therapeutic exercise;Balance training;Neuromuscular re-education;Patient/family education;Manual techniques;Passive range of motion;Dry needling;Taping;Spinal Manipulations;Joint Manipulations    PT Next Visit Plan  MMT for goals; manual treatment to gluteals/joint mobilization as needed; progress hip strength and trunk strength    PT Home Exercise Plan  Access Code: [email or web address]       Patient will benefit from skilled therapeutic intervention in order to improve the following deficits and impairments:  Decreased activity tolerance, Decreased strength, Impaired flexibility, Pain, Decreased range of motion, Decreased balance, Increased muscle spasms  Visit Diagnosis: Low back pain, unspecified back pain laterality, unspecified chronicity, unspecified whether sciatica present  Pain in right hip  Muscle weakness (generalized)     Problem List Patient Active Problem List   Diagnosis Date Noted  . Weight gain 12/10/2017  . Snoring 10/31/2017  . Somnolence, daytime 10/31/2017  . Hypothyroidism 08/28/2017  . Multinodular goiter 07/05/2017  . Therapeutic drug monitoring 10/31/2016  . Neck pain 08/08/2016  . Dyslipidemia   . Depression   . Headache   . Anemia   . Anxiety 10/31/2015  . Migraine without aura and without status migrainosus, not intractable 11/15/2014  . Nonintractable epilepsy without  status epilepticus ([place]) 11/15/2014  . Complex partial seizure ([place]) 11/18/2012  . Migraine without aura 11/18/2012  . Unspecified essential hypertension 11/18/2012  . Drowsiness 11/18/2012  10:30 AM,02/03/19 [place] [place] [facility] [address], [place], [place], [address] Phone: [phone]   Fax:  [phone]  Name: Erica Meyers MRN: [id number] Date of Birth: [birth year]

## 2019-02-05 ENCOUNTER — Encounter: Payer: Medicare Other | Admitting: Physical Therapy

## 2019-02-10 ENCOUNTER — Other Ambulatory Visit: Payer: Self-pay

## 2019-02-10 ENCOUNTER — Ambulatory Visit: Payer: Medicare Other | Admitting: Physical Therapy

## 2019-02-10 ENCOUNTER — Encounter: Payer: Self-pay | Admitting: Physical Therapy

## 2019-02-10 DIAGNOSIS — M25551 Pain in right hip: Secondary | ICD-10-CM

## 2019-02-10 DIAGNOSIS — M6281 Muscle weakness (generalized): Secondary | ICD-10-CM

## 2019-02-10 DIAGNOSIS — M545 Low back pain, unspecified: Secondary | ICD-10-CM

## 2019-02-10 NOTE — Therapy (Addendum)
[facility] [address], [place], [place], [address] Phone: [phone]   Fax:  [phone]  Physical Therapy Treatment  Patient Details  Name: Erica Meyers MRN: [id number] Date of Birth: 1960/02/09 Referring Provider (PT): Dibas Dorthy Cooler, MD   Encounter Date: 02/10/2019  PT End of Session - 02/10/19 0951    Visit Number  6    Date for PT Re-Evaluation  02/27/19    Authorization Time Period  12/30/18 to 02/27/19    PT Start Time  0944    PT Stop Time  1015    PT Time Calculation (min)  31 min    Activity Tolerance  Patient tolerated treatment well;No increased pain    Behavior During Therapy  WFL for tasks assessed/performed       Past Medical History:  Diagnosis Date  . Anemia   . Depression   . Dyslipidemia   . Epilepsy ([place])   . Headache   . Hypertension   . Thyroid disease     Past Surgical History:  Procedure Laterality Date  . CESAREAN SECTION    . TONSILLECTOMY    . TUBAL LIGATION    . VEIN LIGATION AND STRIPPING Left     There were no vitals filed for this visit.  Subjective Assessment - 02/10/19 0949    Subjective  Pt has no complaints at this time.    Pertinent History  epilepsy    Currently in Pain?  No/denies         [facility] PT Assessment - 02/10/19 0001      Strength   Right Hip Extension  4+/5    Right Hip ABduction  4/5   (+) groin pain   Left Hip Extension  4+/5    Left Hip ABduction  4/5              [facility] Adult PT Treatment/Exercise - 02/10/19 0001      Knee/Hip Exercises: Standing   Other Standing Knee Exercises  attempted Rt hip ER on 2nd step but unable due to pain    Other Standing Knee Exercises  Rt and Lt hip hike/lower standing on 6" box, 2x10 reps PT cuing to increase gltueal activation      Knee/Hip Exercises: Supine   Other Supine Knee/Hip Exercises  single leg clam with yellow TB x20 reps each              PT Education - 02/10/19 1014    Education Details  avoiding HEP if soreness disrupts sleep, etc.    Person(s) Educated  Patient    Methods  Explanation;Verbal cues    Comprehension  Verbalized understanding;Returned demonstration       PT Short Term Goals - 02/10/19 1016      PT SHORT TERM GOAL #1   Title  Pt will demo independence with her initial HEP to increase flexibility and strength.    Time  4    Period  Weeks    Status  Achieved    Target Date  01/27/19      PT SHORT TERM GOAL #2   Title  Pt will have greater than 30 deg of passive Lt and Rt hip internal rotation which will improve her mechanics with daily activity.    Time  4    Period  Weeks    Status  New        PT Long Term Goals - 02/10/19 1016      PT LONG  TERM GOAL #1   Title  Pt will have greater than 4/5 MMT strength of BLEs which will improve her efficiency with daily activity and walking.    Baseline  hip abduction/extension    Time  8    Period  Weeks    Status  Partially Met      PT LONG TERM GOAL #2   Title  Pt will have improved functional LE strength evident by her ability to ascend and descend clinic steps x3 trials without excessive trendelenburg deviation.    Time  8    Period  Weeks    Status  New      PT LONG TERM GOAL #3   Title  Pt will report atleast 50% improvement in her hip/low back pain from the start of PT.    Time  8    Period  Weeks    Status  New      PT LONG TERM GOAL #4   Title  Pt will be able to complete 30 minute walk atleast 3 days a week without needing to stop secondary to hip/back pain.    Time  8    Period  Weeks    Status  New            Plan - 02/10/19 1002    Clinical Impression Statement  Pt is making progress towards her goals. Her LE strength has increased to 4 and 4+/5 MMT since the evaluation. Pt continues to have discomfort in her Rt groin, but she notes this is decreasing in frequency throughout the day. Session continued with therex to promote glute strength. Pt required tactile  and verbal cuing with several exercises to increase her understanding and decrease discomfort in the Rt hip. Pt would continue to benefit from skilled PT moving forward to encourage pain free activity.    Personal Factors and Comorbidities  Time since onset of injury/illness/exacerbation    Rehab Potential  Good    PT Frequency  2x / week    PT Duration  8 weeks    PT Treatment/Interventions  ADLs/Self Care Home Management;Electrical Stimulation;Moist Heat;Traction;Therapeutic activities;Therapeutic exercise;Balance training;Neuromuscular re-education;Patient/family education;Manual techniques;Passive range of motion;Dry needling;Taping;Spinal Manipulations;Joint Manipulations    PT Next Visit Plan  manual treatment to gluteals/joint mobilization as needed; progress hip strength and trunk strength    PT Home Exercise Plan  Access Code: [email or web address]       Patient will benefit from skilled therapeutic intervention in order to improve the following deficits and impairments:  Decreased activity tolerance, Decreased strength, Impaired flexibility, Pain, Decreased range of motion, Decreased balance, Increased muscle spasms  Visit Diagnosis: Low back pain, unspecified back pain laterality, unspecified chronicity, unspecified whether sciatica present  Pain in right hip  Muscle weakness (generalized)     Problem List Patient Active Problem List   Diagnosis Date Noted  . Weight gain 12/10/2017  . Snoring 10/31/2017  . Somnolence, daytime 10/31/2017  . Hypothyroidism 08/28/2017  . Multinodular goiter 07/05/2017  . Therapeutic drug monitoring 10/31/2016  . Neck pain 08/08/2016  . Dyslipidemia   . Depression   . Headache   . Anemia   . Anxiety 10/31/2015  . Migraine without aura and without status migrainosus, not intractable 11/15/2014  . 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 Complex partial seizure ([place]) 11/18/2012  . Migraine without aura 11/18/2012  .  Unspecified essential hypertension 11/18/2012  . Drowsiness 11/18/2012   10:17 AM,02/10/19 Sherol Dade PT, DPT [facility] PHYSICAL THERAPY DISCHARGE SUMMARY  Visits from Start of Care: 6  Current functional level related to goals / functional outcomes: Pt didn't return after visit on 02/10/19.  See above for current status.     Remaining deficits: See above.     Education / Equipment: HEP Plan: Patient agrees to discharge.  Patient goals were partially met. Patient is being discharged due to not returning since the last visit.  ?????        Sigurd Sos, PT 04/01/19 10:29 AM  [facility] [address], [place], [place], [address] Phone: [phone]   Fax:  [phone]  Name: JANICE BODINE MRN: [id number] Date of Birth: Jun 10, 1960

## 2019-02-11 ENCOUNTER — Telehealth: Payer: Self-pay | Admitting: Radiology

## 2019-02-11 NOTE — Telephone Encounter (Signed)
I would think so, will see what Dr. Johnsie Cancel advises.

## 2019-02-11 NOTE — Telephone Encounter (Signed)
Yes should have ETT

## 2019-02-11 NOTE — Telephone Encounter (Signed)
Exercise treadmill test was ordered back in April 2020. When the treadmill room was shut down due to covid. Please advise if patient still needs test scheduled.   If no longer needed please cancel the order

## 2019-02-12 ENCOUNTER — Ambulatory Visit: Payer: Medicare Other | Admitting: Physical Therapy

## 2019-02-26 ENCOUNTER — Ambulatory Visit: Payer: Medicare Other | Admitting: Physical Therapy

## 2019-03-18 ENCOUNTER — Encounter: Payer: Self-pay | Admitting: Family Medicine

## 2019-04-08 IMAGING — NM NM THYROID IMAGING W/ UPTAKE SINGLE (24 HR)
4 series · 4 of 4 positions shown · non-contrast
Comparison: None

CLINICAL DATA: Hyperthyroidism

EXAM:
THYROID SCAN AND UPTAKE - 24 HOURS
TECHNIQUE: Following the per oral administration of H-P9P sodium iodide, the
patient returned at 24 hours and uptake measurements were acquired
with the uptake probe centered on the neck. Thyroid imaging was
performed following the intravenous administration of the Vc-TTm
Pertechnetate.
RADIOPHARMACEUTICALS:  13 MicroCuries H-P9P sodium iodide orally and
10.2 mCi technetium-77m pertechnetate IV

[th thyroid scan · 1.14mm/px · 1 of 1 slices shown (1 of 4)]
[im 1/1]
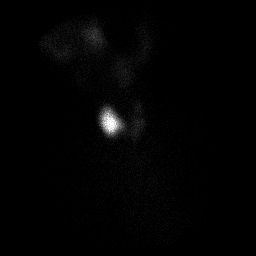

[th thyroid scan · 1.14mm/px · 1 of 1 slices shown (2 of 4)]
[im 1/1]
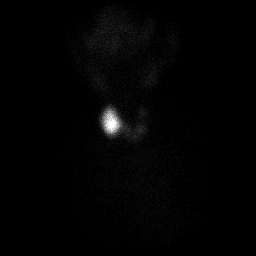

[th thyroid scan · 1.14mm/px · 1 of 1 slices shown (3 of 4)]
[im 1/1]
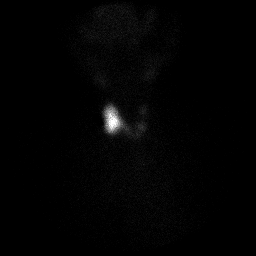

[th thyroid scan · 1.14mm/px · 1 of 1 slices shown (4 of 4)]
[im 1/1]
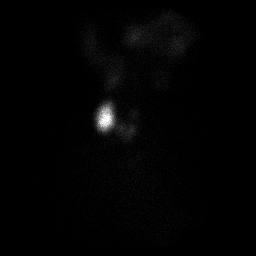

[4 of 4 positions shown; findings below may reference images not displayed]

FINDINGS: 24 hour radio iodine uptake is calculated at 18%, within the normal
range.

Images of the thyroid gland demonstrate a warm nodule at the
superior pole of the RIGHT thyroid compatible with a hyperfunctional
thyroid adenoma.

Question subtle warm and cold nodularity in the LEFT thyroid lobe.
IMPRESSION: Normal 24 hour radioiodine uptake of 18%.

Dominant hyper functional adenoma at the superior RIGHT thyroid
lobe.

Questionable smaller nodules within the LEFT thyroid lobe;
correlation with ultrasound recommended.

## 2019-04-16 ENCOUNTER — Other Ambulatory Visit: Payer: Self-pay | Admitting: Family Medicine

## 2019-04-16 DIAGNOSIS — Z1231 Encounter for screening mammogram for malignant neoplasm of breast: Secondary | ICD-10-CM

## 2019-04-17 ENCOUNTER — Other Ambulatory Visit: Payer: Self-pay

## 2019-04-17 ENCOUNTER — Ambulatory Visit
Admission: RE | Admit: 2019-04-17 | Discharge: 2019-04-17 | Disposition: A | Discharge status: Home / Self Care | Payer: Medicare Other | Source: Ambulatory Visit | Attending: Family Medicine | Admitting: Family Medicine

## 2019-04-17 DIAGNOSIS — Z1231 Encounter for screening mammogram for malignant neoplasm of breast: Secondary | ICD-10-CM

## 2019-05-04 ENCOUNTER — Encounter: Payer: Self-pay | Admitting: Family Medicine

## 2019-05-11 ENCOUNTER — Ambulatory Visit: Payer: Medicare Other | Admitting: Cardiology

## 2019-05-15 ENCOUNTER — Inpatient Hospital Stay (HOSPITAL_COMMUNITY): Admission: RE | Admit: 2019-05-15 | Payer: Medicare Other | Source: Ambulatory Visit

## 2019-05-19 ENCOUNTER — Ambulatory Visit (INDEPENDENT_AMBULATORY_CARE_PROVIDER_SITE_OTHER): Payer: Medicare Other

## 2019-05-19 ENCOUNTER — Other Ambulatory Visit: Payer: Self-pay

## 2019-05-19 DIAGNOSIS — R079 Chest pain, unspecified: Secondary | ICD-10-CM | POA: Diagnosis not present

## 2019-05-19 LAB — EXERCISE TOLERANCE TEST
Estimated workload: 7 METS
Exercise duration (min): 6 min
Exercise duration (sec): 24 s
MPHR: 162 {beats}/min
Peak HR: 139 {beats}/min
Percent HR: 85 %
RPE: 17
Rest HR: 88 {beats}/min

## 2019-05-19 NOTE — Progress Notes (Signed)
Virtual Visit via Video Note   This visit type was conducted due to national recommendations for restrictions regarding the COVID-19 Pandemic (e.g. social distancing) in an effort to limit this patient's exposure and mitigate transmission in our community.  Due to her co-morbid illnesses, this patient is at least at moderate risk for complications without adequate follow up.  This format is felt to be most appropriate for this patient at this time.  All issues noted in this document were discussed and addressed.  A limited physical exam was performed with this format.  Please refer to the patient's chart for her consent to telehealth for [facility]   Video visit for new patient done using Doximity   Evaluation Performed:  Follow-up visit  Date:  05/26/2019   ID:  Erica Meyers, DOB [birth year], MRN [id number]  Patient Location: Home Provider Location: Office  PCP:  Lujean Amel, MD  Cardiologist:  New/Erica Meyers Electrophysiologist:  None   Chief Complaint:  Chest Pain  Referred by Dr Dorthy Cooler   History of Present Illness:    Erica Meyers is a 59 y.o. female with HTN, Seizures, and migraines Seen in ER 04/27/18 for chest pain. Persistent for 5-6 days Atypical sharp pain in upper left chest Also with some left sided neck pain like she slept poorly on it R/O normal CXR and ECG D/C home with diagnosis of muscular pain. To f/u with primary who referred her to cardiology for stress testing   She has severe anxiety and depression Was teary eyed during encounter Has two grandchildren living with her now and they may have autism. She indicates that her kids don't listen to her advice She is about to see a therapist soon. Still with occasional exertional pain in chest since d/c   ETT finally done 05/19/19 and normal    She seems to be doing better and only pains she has are muscular. Still has gained a lot of weight last 2 years close to 60 lbs. Getting vaccine end of week and shingles  shot after that   The patient does not have symptoms concerning for COVID-19 infection (fever, chills, cough, or new shortness of breath).    Past Medical History:  Diagnosis Date  . Anemia   . Depression   . Dyslipidemia   . Epilepsy ([place])   . Headache   . Hypertension   . Thyroid disease    Past Surgical History:  Procedure Laterality Date  . CESAREAN SECTION    . TONSILLECTOMY    . TUBAL LIGATION    . VEIN LIGATION AND STRIPPING Left      Current Meds  Medication Sig  . atorvastatin (LIPITOR) 20 MG tablet Take 20 mg by mouth daily.  . cholecalciferol (VITAMIN D) 1000 UNITS tablet Take 1,000 Units by mouth daily.  Marland Kitchen lamoTRIgine (LAMICTAL) 150 MG tablet TAKE 2 TABLETS BY MOUTH TWICE A DAY  . levothyroxine (SYNTHROID) 75 MCG tablet TAKE 1 TABLET (75 MCG TOTAL) BY MOUTH DAILY BEFORE BREAKFAST.  Marland Kitchen lisinopril-hydrochlorothiazide (PRINZIDE,ZESTORETIC) 20-25 MG tablet TAKE 1 TABLET BY MOUTH ONCE A DAY (WILL PAY 3/14)  . meloxicam (MOBIC) 7.5 MG tablet Take 7.5 mg by mouth daily.  . Multiple Vitamin (MULTIVITAMIN WITH MINERALS) TABS tablet Take 1 tablet by mouth daily.  . rizatriptan (MAXALT-MLT) 10 MG disintegrating tablet Take 1 tablet (10 mg total) by mouth as needed for migraine. May repeat in 2 hours if needed  . sertraline (ZOLOFT) 100 MG tablet Take 100 mg by mouth  daily.  . topiramate (TOPAMAX) 100 MG tablet Take 1 tablet (100 mg total) by mouth 2 (two) times daily.     Allergies:   Penicillins   Social History   Tobacco Use  . Smoking status: Never Smoker  . Smokeless tobacco: Never Used  Substance Use Topics  . Alcohol use: No  . Drug use: No     Family Hx: The patient's family history includes Hypertension in her mother; Stroke in her mother; Thyroid disease in her mother.  ROS:   Please see the history of present illness.     All other systems reviewed and are negative.   Prior CV studies:   The following studies were reviewed today:  ER notes labs  CXR and ECG   Labs/Other Tests and Data Reviewed:    EKG:   NSR normal ECG   Recent Labs: 06/12/2018: TSH 1.33 10/14/2018: ALT 20; BUN 21; Creatinine, Ser 1.01; Hemoglobin 14.5; Platelets 236; Potassium 3.5; Sodium 134   Recent Lipid Panel No results found for: CHOL, TRIG, HDL, CHOLHDL, LDLCALC, LDLDIRECT  Wt Readings from Last 3 Encounters:  05/26/19 180 lb (81.6 kg)  10/14/18 184 lb (83.5 kg)  06/12/18 180 lb 3.2 oz (81.7 kg)     Objective:    Vital Signs:  BP 122/80   Pulse 76   Ht 5\' 2"  (1.575 m)   Wt 180 lb (81.6 kg)   LMP 01/05/2016   BMI 32.92 kg/m    No JVP elevation No tachypnea No edema Skin warm and dry   ASSESSMENT & PLAN:    1. Chest Pain : atypical with ETT 05/19/19 Normal Observe 2. HLD:  On statin labs with primary  3. DM:  Discussed low carb diet.  Target hemoglobin A1c is 6.5 or less.  Continue current medications. 4. HTN:  Well controlled.  Continue current medications and low sodium Dash type diet.   5. Seizures:  Quiescent continue Lamictal 6. Thyroid:  Continue replacement TSH normal  7. Anxiety/Depression:  Major issue to start seeing therapist   COVID-19 Education: The signs and symptoms of COVID-19 were discussed with the patient and how to seek care for testing (follow up with PCP or arrange E-visit).  The importance of social distancing was discussed today.  Time:   Today, I have spent 30 minutes reviewing patient chart , ETT primary care notes interviewing patient and composing note    Medication Adjustments/Labs and Tests Ordered: Current medicines are reviewed at length with the patient today.  Concerns regarding medicines are outlined above.   Tests Ordered:  None  Medication Changes: No orders of the defined types were placed in this encounter.   Disposition:  Follow up prn  Signed, Jenkins Rouge, MD  05/26/2019 7:56 AM    [place]

## 2019-05-26 ENCOUNTER — Telehealth (INDEPENDENT_AMBULATORY_CARE_PROVIDER_SITE_OTHER): Payer: Medicare Other | Admitting: Cardiovascular Disease

## 2019-05-26 VITALS — BP 122/80 | HR 76 | Ht 62.0 in | Wt 180.0 lb

## 2019-05-26 DIAGNOSIS — R079 Chest pain, unspecified: Secondary | ICD-10-CM

## 2019-05-26 NOTE — Patient Instructions (Addendum)
Medication Instructions:  *If you need a refill on your cardiac medications before your next appointment, please call your pharmacy*  Lab Work: If you have labs (blood work) drawn today and your tests are completely normal, you will receive your results only by: . MyChart Message (if you have MyChart) OR . A paper copy in the mail If you have any lab test that is abnormal or we need to change your treatment, we will call you to review the results.  Testing/Procedures: None ordered today.   Follow-Up: At [facility], you and your health needs are our priority.  As part of our continuing mission to provide you with exceptional heart care, we have created designated Provider Care Teams.  These Care Teams include your primary Cardiologist (physician) and Advanced Practice Providers (APPs -  Physician Assistants and Nurse Practitioners) who all work together to provide you with the care you need, when you need it.  We recommend signing up for the patient portal called "MyChart".  Sign up information is provided on this After Visit Summary.  MyChart is used to connect with patients for Virtual Visits (Telemedicine).  Patients are able to view lab/test results, encounter notes, upcoming appointments, etc.  Non-urgent messages can be sent to your provider as well.   To learn more about what you can do with MyChart, go to [email or web address].    Your next appointment:   1 year  The format for your next appointment:   In Person  Provider:   You may see Dr. Nishan or one of the following Advanced Practice Providers on your designated Care Team:    Lori Gerhardt, NP  Laura Ingold, NP  Jill McDaniel, NP    

## 2019-09-06 ENCOUNTER — Other Ambulatory Visit: Payer: Self-pay | Admitting: Endocrinology

## 2019-09-06 NOTE — Telephone Encounter (Signed)
1.  Please schedule f/u appt 2.  Then please refill x 2 mos, pending that appt.  

## 2019-09-29 ENCOUNTER — Telehealth: Payer: Self-pay | Admitting: Endocrinology

## 2019-09-29 NOTE — Telephone Encounter (Signed)
Let's address at [facility]

## 2019-09-29 NOTE — Telephone Encounter (Signed)
Last entry from 06/12/18:  Instructions  Blood tests are requested for you today.  We'll let you know about the results.  Let's recheck the ultrasound.  you will receive a phone call, about a day and time for an appointment.   Please come back for a follow-up appointment in 6 months.       I assume that based on above, you would prefer to address during her Sept visit? Please advise

## 2019-09-29 NOTE — Telephone Encounter (Signed)
Patient called stating someone was supposed to be calling her to let her know if she needed to have a scan done prior to her appointment to see what is going on with her thyroid nodules. Ph# [phone]

## 2019-09-30 NOTE — Telephone Encounter (Signed)
Following message sent via My Chart:  Hi Evalie,  Dr. Loanne Drilling has advised that any necessary testing or imaging will be addressed at your next office visit. No imaging nor testing will be ordered at this time.  Thank you

## 2019-10-07 ENCOUNTER — Ambulatory Visit: Payer: Medicare Other | Admitting: Endocrinology

## 2019-10-16 ENCOUNTER — Other Ambulatory Visit (INDEPENDENT_AMBULATORY_CARE_PROVIDER_SITE_OTHER): Payer: Medicare Other

## 2019-10-16 ENCOUNTER — Other Ambulatory Visit: Payer: Self-pay

## 2019-10-16 ENCOUNTER — Ambulatory Visit (INDEPENDENT_AMBULATORY_CARE_PROVIDER_SITE_OTHER): Payer: Medicare Other | Admitting: Endocrinology

## 2019-10-16 VITALS — BP 128/82 | HR 83 | Ht 62.0 in | Wt 190.0 lb

## 2019-10-16 DIAGNOSIS — R635 Abnormal weight gain: Secondary | ICD-10-CM

## 2019-10-16 DIAGNOSIS — E890 Postprocedural hypothyroidism: Secondary | ICD-10-CM | POA: Diagnosis not present

## 2019-10-16 DIAGNOSIS — E042 Nontoxic multinodular goiter: Secondary | ICD-10-CM | POA: Diagnosis not present

## 2019-10-16 LAB — TSH: TSH: 2.95 u[IU]/mL (ref 0.35–4.50)

## 2019-10-16 LAB — T4, FREE: Free T4: 0.77 ng/dL (ref 0.60–1.60)

## 2019-10-16 NOTE — Progress Notes (Signed)
Subjective:    Patient ID: Erica Meyers, female    DOB: March 25, 1960, 59 y.o.   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  Despite reduction in size, nodule #1 within the right lobe of the thyroid continues to meet imaging criteria to recommend bx--which in 2019 was cat 1; [place] in 2020 showed shrinkage of the nodules).  Main symptoms are weight gain and fatigue.   Past Medical History:  Diagnosis Date  . Anemia   . Depression   . Dyslipidemia   . Epilepsy ([place])   . Headache   . Hypertension   . Thyroid disease     Past Surgical History:  Procedure Laterality Date  . CESAREAN SECTION    . TONSILLECTOMY    . TUBAL LIGATION    . VEIN LIGATION AND STRIPPING Left     Social History   Socioeconomic History  . Marital status: Single    Spouse name: Not on file  . Number of children: Not on file  . Years of education: Not on file  . Highest education level: Not on file  Occupational History  . Not on file  Tobacco Use  . Smoking status: Never Smoker  . Smokeless tobacco: Never Used  Vaping Use  . Vaping Use: Never used  Substance and Sexual Activity  . Alcohol use: No  . Drug use: No  . Sexual activity: Not on file  Other Topics Concern  . Not on file  Social History Narrative  . Not on file   Social Determinants of Health   Financial Resource Strain:   . Difficulty of Paying Living Expenses: Not on file  Food Insecurity:   . Worried About Charity fundraiser in the Last Year: Not on file  . Ran Out of Food in the Last Year: Not on file  Transportation Needs:   . Lack of Transportation (Medical): Not on file  . Lack of  Transportation (Non-Medical): Not on file  Physical Activity:   . Days of Exercise per Week: Not on file  . Minutes of Exercise per Session: Not on file  Stress:   . Feeling of Stress : Not on file  Social Connections:   . Frequency of Communication with Friends and Family: Not on file  . Frequency of Social Gatherings with Friends and Family: Not on file  . Attends Religious Services: Not on file  . Active Member of Clubs or Organizations: Not on file  . Attends Archivist Meetings: Not on file  . Marital Status: Not on file  Intimate Partner Violence:   . Fear of Current or Ex-Partner: Not on file  . Emotionally Abused: Not on file  . Physically Abused: Not on file  . Sexually Abused: Not on file    Current Outpatient Medications on File Prior to Visit  Medication Sig Dispense Refill  . atorvastatin (LIPITOR) 20 MG tablet Take 20 mg by mouth daily.    . cholecalciferol (VITAMIN D) 1000 UNITS tablet Take 1,000 Units by mouth daily.    Marland Kitchen lamoTRIgine (LAMICTAL) 150 MG tablet TAKE 2 TABLETS BY MOUTH TWICE A DAY 360 tablet 2  . levothyroxine (  SYNTHROID) 75 MCG tablet TAKE 1 TABLET (75 MCG TOTAL) BY MOUTH DAILY BEFORE BREAKFAST. 90 tablet 3  . lisinopril-hydrochlorothiazide (PRINZIDE,ZESTORETIC) 20-25 MG tablet TAKE 1 TABLET BY MOUTH ONCE A DAY (WILL PAY 3/14)  11  . meloxicam (MOBIC) 7.5 MG tablet Take 7.5 mg by mouth daily.    . Multiple Vitamin (MULTIVITAMIN WITH MINERALS) TABS tablet Take 1 tablet by mouth daily.    . rizatriptan (MAXALT-MLT) 10 MG disintegrating tablet Take 1 tablet (10 mg total) by mouth as needed for migraine. May repeat in 2 hours if needed 12 tablet 11  . sertraline (ZOLOFT) 100 MG tablet Take 100 mg by mouth daily.  0  . topiramate (TOPAMAX) 100 MG tablet Take 1 tablet (100 mg total) by mouth 2 (two) times daily. 180 tablet 3   No current facility-administered medications on file prior to visit.    Allergies  Allergen Reactions  . Penicillins      Childhood, does not recall reaction.    Family History  Problem Relation Age of Onset  . Hypertension Mother   . Stroke Mother   . Thyroid disease Mother     BP 128/82   Pulse 83   Ht 5\' 2"  (1.575 m)   Wt 190 lb (86.2 kg)   LMP 01/05/2016   SpO2 90%   BMI 34.75 kg/m    Review of Systems Denies neck pain    Objective:   Physical Exam VITAL SIGNS:  See vs page GENERAL: no distress NECK: thyroid is slightly enlarged, with irreg surface.    Lab Results  Component Value Date   TSH 2.95 10/16/2019       Assessment & Plan:  Hypothyroidism: well-replaced. Please continue the same synthroid. MNG: due for recheck.  Obesity, persistent.  Patient Instructions  Blood tests are requested for you today.  We'll let you know about the results.  Let's recheck the ultrasound.  you will receive a phone call, about a day and time for an appointment.   Please see a weight loss specialist.  you will receive a phone call, about a day and time for an appointment Please come back for a follow-up appointment in 1year.

## 2019-10-16 NOTE — Patient Instructions (Addendum)
Blood tests are requested for you today.  We'll let you know about the results.  Let's recheck the ultrasound.  you will receive a phone call, about a day and time for an appointment.   Please see a weight loss specialist.  you will receive a phone call, about a day and time for an appointment Please come back for a follow-up appointment in 1year.

## 2019-10-23 ENCOUNTER — Telehealth: Payer: Self-pay | Admitting: Endocrinology

## 2019-10-23 ENCOUNTER — Ambulatory Visit
Admission: RE | Admit: 2019-10-23 | Discharge: 2019-10-23 | Disposition: A | Discharge status: Home / Self Care | Payer: Medicare Other | Source: Ambulatory Visit | Attending: Endocrinology | Admitting: Endocrinology

## 2019-10-23 DIAGNOSIS — E042 Nontoxic multinodular goiter: Secondary | ICD-10-CM

## 2019-10-23 NOTE — Telephone Encounter (Signed)
Patient called stating Dr Loanne Drilling contacted her on mychart with her ultrasound results and she wanted him or a nurse to give her a call back to discuss. Ph# [phone]

## 2019-10-26 NOTE — Telephone Encounter (Signed)
Left message for patient to return call to discuss results.

## 2019-10-27 NOTE — Telephone Encounter (Signed)
Left message for patient to return call to discuss results.

## 2019-11-11 ENCOUNTER — Other Ambulatory Visit: Payer: Self-pay

## 2019-11-11 ENCOUNTER — Encounter: Payer: Self-pay | Admitting: Skilled Nursing Facility1

## 2019-11-11 ENCOUNTER — Encounter: Payer: Medicare Other | Attending: Family Medicine | Admitting: Skilled Nursing Facility1

## 2019-11-11 DIAGNOSIS — E119 Type 2 diabetes mellitus without complications: Secondary | ICD-10-CM | POA: Insufficient documentation

## 2019-11-11 NOTE — Progress Notes (Signed)
Pt states she walks getting 5000 steps a day and does pool exercises.  Pt states she feels exhausted all the time.  Pt states in her early 2's she was about 140 pounds (accoring to weight history in Epic around 180 pounds has been the norm since 2014). Pt states her last seizure was last week.  Pt states she eats gluten free because she felt gluten gave her rashes (eating non gluten free pasta and bread). Pt states she does not eat white potato, red meat, or white rice. Pt states she avoids all soy because someone told her she cannnot it. Pt states her daughter will not let her cook unless she is there due to her forgetfulness or possibly having a seizure. Pt states she feels she is allergic to tomatoes (red sauce). Pt states her daughter eats vegan. Pt states she feels she has Lost her independence by not being able to drive she has a lost of stress surrounding that.  Pt states she plans on working with a talk therapist.   Pt was given support group information.    Body Composition Scale 11/11/2019  Current Body Weight 187.3  Total Body Fat % 40  Visceral Fat 12  Fat-Free Mass % 59.9   Total Body Water % 44.4  Muscle-Mass lbs 28.6  BMI 32.8  Body Fat Displacement          Torso  lbs 46.4         Left Leg  lbs 9.2         Right Leg  lbs 9.2         Left Arm  lbs 4.6         Right Arm   lbs 4.6   Diabetes Self-Management Education  Visit Type: First/Initial  11/12/2019  Ms. Hampton Cost, identified by name and date of birth, is a 59 y.o. female with a diagnosis of Diabetes: Type 2.   ASSESSMENT  Height 5\' 3"  (1.6 m), weight 187 lb 4.8 oz (85 kg), last menstrual period 01/05/2016. Body mass index is 33.18 kg/m.   Diabetes Self-Management Education - 11/11/19 1124      Visit Information   Visit Type First/Initial      Initial Visit   Diabetes Type Type 2    Are you currently following a meal plan? No    Are you taking your medications as prescribed? Not on Medications       Health Coping   How would you rate your overall health? Good      Psychosocial Assessment   Patient Belief/Attitude about Diabetes Motivated to manage diabetes    Self-care barriers None    Self-management support Friends;Family    Learning Readiness Contemplating      Pre-Education Assessment   Patient understands the diabetes disease and treatment process. Needs Instruction    Patient understands incorporating nutritional management into lifestyle. Needs Instruction    Patient undertands incorporating physical activity into lifestyle. Needs Instruction    Patient understands using medications safely. Needs Instruction    Patient understands monitoring blood glucose, interpreting and using results Needs Instruction    Patient understands prevention, detection, and treatment of acute complications. Needs Instruction    Patient understands prevention, detection, and treatment of chronic complications. Needs Instruction    Patient understands how to develop strategies to address psychosocial issues. Needs Instruction    Patient understands how to develop strategies to promote health/change behavior. Needs Instruction      Complications   Last  HgB A1C per patient/outside source 6.6 %    How often do you check your blood sugar? 0 times/day (not testing)    Have you had a dilated eye exam in the past 12 months? Yes    Have you had a dental exam in the past 12 months? Yes    Are you checking your feet? No      Dietary Intake   Breakfast 2 eggs + toast + fruit    Snack (morning) smoothie: kale or spinach + cocnut milk + berries + protein powder    Lunch beans + spring mix + canteloupe    Snack (afternoon) nuts    Beverage(s) smart water, juicing      Exercise   Exercise Type ADL's;Light (walking / raking leaves)    How many days per week to you exercise? 20    How many minutes per day do you exercise? 5    Total minutes per week of exercise 100      Patient Education    Previous Diabetes Education No      Individualized Goals (developed by patient)   Nutrition Follow meal plan discussed;General guidelines for healthy choices and portions discussed    Physical Activity Exercise 5-7 days per week;45 minutes per day      Post-Education Assessment   Patient understands the diabetes disease and treatment process. Demonstrates understanding / competency    Patient understands incorporating nutritional management into lifestyle. Demonstrates understanding / competency    Patient undertands incorporating physical activity into lifestyle. Demonstrates understanding / competency    Patient understands using medications safely. Demonstrates understanding / competency    Patient understands monitoring blood glucose, interpreting and using results Demonstrates understanding / competency    Patient understands prevention, detection, and treatment of acute complications. Demonstrates understanding / competency    Patient understands prevention, detection, and treatment of chronic complications. Demonstrates understanding / competency    Patient understands how to develop strategies to address psychosocial issues. Demonstrates understanding / competency    Patient understands how to develop strategies to promote health/change behavior. Demonstrates understanding / competency      Outcomes   Expected Outcomes Demonstrated interest in learning. Expect positive outcomes    Future DMSE PRN    Program Status Completed           Individualized Plan for Diabetes Self-Management Training:   Learning Objective:  Patient will have a greater understanding of diabetes self-management. Patient education plan is to attend individual and/or group sessions per assessed needs and concerns.   Plan:   There are no Patient Instructions on file for this visit.  Expected Outcomes:  Demonstrated interest in learning. Expect positive outcomes  Education material provided: ADA - How to  Thrive: A Guide for Your Journey with Diabetes, My Plate, Snack sheet and Support group flyer  If problems or questions, patient to contact team via:  Phone  Future DSME appointment: PRN

## 2019-12-16 ENCOUNTER — Other Ambulatory Visit: Payer: Self-pay | Admitting: *Deleted

## 2019-12-16 DIAGNOSIS — E890 Postprocedural hypothyroidism: Secondary | ICD-10-CM

## 2019-12-16 MED ORDER — LEVOTHYROXINE SODIUM 75 MCG PO TABS: 75.0000 ug | ORAL_TABLET | Freq: Every day | ORAL | 3 refills | Status: DC

## 2019-12-19 ENCOUNTER — Other Ambulatory Visit: Payer: Self-pay | Admitting: Neurology

## 2019-12-26 DIAGNOSIS — E119 Type 2 diabetes mellitus without complications: Secondary | ICD-10-CM | POA: Diagnosis not present

## 2019-12-26 DIAGNOSIS — H5319 Other subjective visual disturbances: Secondary | ICD-10-CM | POA: Insufficient documentation

## 2019-12-26 DIAGNOSIS — G43109 Migraine with aura, not intractable, without status migrainosus: Secondary | ICD-10-CM | POA: Diagnosis not present

## 2019-12-26 DIAGNOSIS — I10 Essential (primary) hypertension: Secondary | ICD-10-CM | POA: Diagnosis not present

## 2019-12-27 ENCOUNTER — Emergency Department (HOSPITAL_BASED_OUTPATIENT_CLINIC_OR_DEPARTMENT_OTHER)
Admission: EM | Admit: 2019-12-27 | Discharge: 2019-12-27 | Disposition: A | Discharge status: Home / Self Care | Payer: Medicare Other | Attending: Emergency Medicine | Admitting: Emergency Medicine

## 2019-12-27 ENCOUNTER — Encounter (HOSPITAL_BASED_OUTPATIENT_CLINIC_OR_DEPARTMENT_OTHER): Payer: Self-pay | Admitting: Emergency Medicine

## 2019-12-27 ENCOUNTER — Other Ambulatory Visit: Payer: Self-pay

## 2019-12-27 ENCOUNTER — Emergency Department (HOSPITAL_BASED_OUTPATIENT_CLINIC_OR_DEPARTMENT_OTHER): Payer: Medicare Other

## 2019-12-27 DIAGNOSIS — H53129 Transient visual loss, unspecified eye: Secondary | ICD-10-CM

## 2019-12-27 DIAGNOSIS — G43109 Migraine with aura, not intractable, without status migrainosus: Secondary | ICD-10-CM

## 2019-12-27 LAB — CBG MONITORING, ED: Glucose-Capillary: 145 mg/dL — ABNORMAL HIGH (ref 70–99)

## 2019-12-27 MED ORDER — ACETAMINOPHEN 500 MG PO TABS
1000.0000 mg | ORAL_TABLET | Freq: Once | ORAL | Status: AC
  Administered 2019-12-27: 1000 mg via ORAL
  Filled 2019-12-27: qty 2

## 2019-12-27 MED ORDER — DIPHENHYDRAMINE HCL 25 MG PO CAPS
25.0000 mg | ORAL_CAPSULE | Freq: Once | ORAL | Status: AC
  Administered 2019-12-27: 25 mg via ORAL
  Filled 2019-12-27: qty 1

## 2019-12-27 MED ORDER — PROCHLORPERAZINE MALEATE 10 MG PO TABS
10.0000 mg | ORAL_TABLET | Freq: Once | ORAL | Status: AC
  Administered 2019-12-27: 10 mg via ORAL
  Filled 2019-12-27: qty 1

## 2019-12-27 NOTE — ED Triage Notes (Addendum)
BIB family member for near syncopal event at 2030 - family describes it as "fragmented" vision, "feeling like ovulating" headache and "a far off look in her eyes."

## 2019-12-27 NOTE — ED Notes (Signed)
Taken to CT at this time. 

## 2019-12-27 NOTE — ED Provider Notes (Signed)
[facility] Emergency Department Provider Note MRN:  [id number]  Arrival date & time: 12/27/19     Chief Complaint   Visual disturbance History of Present Illness   Erica Meyers is a 59 y.o. year-old female with a history of diabetes, epilepsy, hypertension, migraines presenting to the ED with chief complaint of visual disturbance.  At about 9:30 PM patient experienced a sudden visual disturbance described as looking through a broken window.  She was seen by her daughter trying to focus her eyes.  She never lost consciousness, she never exhibited seizure-like activity.  She denies any chest pain or shortness of breath, no abdominal pain, no numbness or weakness to the arms or legs.  Shortly after this visual disturbance, which resolved after a few moments, she began experiencing right-sided headache.  Headache seems to be worse with bright lights and loud noises.  This is the third time she is experienced this visual disturbance over the past 3 weeks, and she is wondering if it is related to her new antiseizure desvenlafaxine medication.  Review of Systems  A complete 10 system review of systems was obtained and all systems are negative except as noted in the HPI and PMH.   Patient's Health History    Past Medical History:  Diagnosis Date  . Anemia   . Depression   . Diabetes mellitus without complication ([place])   . Dyslipidemia   . Epilepsy ([place])   . Headache   . Hypertension   . Thyroid disease     Past Surgical History:  Procedure Laterality Date  . CESAREAN SECTION    . TONSILLECTOMY    . TUBAL LIGATION    . VEIN LIGATION AND STRIPPING Left     Family History  Problem Relation Age of Onset  . Hypertension Mother   . Stroke Mother   . Thyroid disease Mother     Social History   Socioeconomic History  . Marital status: Divorced    Spouse name: Not on file  . Number of children: Not on file  . Years of education: Not on file  .  Highest education level: Not on file  Occupational History  . Not on file  Tobacco Use  . Smoking status: Never Smoker  . Smokeless tobacco: Never Used  Vaping Use  . Vaping Use: Never used  Substance and Sexual Activity  . Alcohol use: No  . Drug use: No  . Sexual activity: Not on file  Other Topics Concern  . Not on file  Social History Narrative  . Not on file   Social Determinants of Health   Financial Resource Strain:   . Difficulty of Paying Living Expenses: Not on file  Food Insecurity:   . Worried About Charity fundraiser in the Last Year: Not on file  . Ran Out of Food in the Last Year: Not on file  Transportation Needs:   . Lack of Transportation (Medical): Not on file  . Lack of Transportation (Non-Medical): Not on file  Physical Activity:   . Days of Exercise per Week: Not on file  . Minutes of Exercise per Session: Not on file  Stress:   . Feeling of Stress : Not on file  Social Connections:   . Frequency of Communication with Friends and Family: Not on file  . Frequency of Social Gatherings with Friends and Family: Not on file  . Attends Religious Services: Not on file  . Active Member of Clubs or Organizations:  Not on file  . Attends Archivist Meetings: Not on file  . Marital Status: Not on file  Intimate Partner Violence:   . Fear of Current or Ex-Partner: Not on file  . Emotionally Abused: Not on file  . Physically Abused: Not on file  . Sexually Abused: Not on file     Physical Exam   Vitals:   12/27/19 0129 12/27/19 0130  BP:  (!) 143/77  Pulse:  61  Resp:  19  Temp:    SpO2: 96% 100%    CONSTITUTIONAL: Well-appearing, NAD NEURO:  Alert and oriented x 3, normal and symmetric strength and sensation, normal coordination, normal speech EYES:  eyes equal and reactive ENT/NECK:  no LAD, no JVD CARDIO: Regular rate, well-perfused, normal S1 and S2 PULM:  CTAB no wheezing or rhonchi GI/GU:  normal bowel sounds, non-distended,  non-tender MSK/SPINE:  No gross deformities, no edema SKIN:  no rash, atraumatic PSYCH:  Appropriate speech and behavior  *Additional and/or pertinent findings included in MDM below  Diagnostic and Interventional Summary    EKG Interpretation  Date/Time:  Sunday December 27 2019 00:11:41 EST Ventricular Rate:  67 PR Interval:    QRS Duration: 88 QT Interval:  400 QTC Calculation: 423 R Axis:   7 Text Interpretation: Sinus rhythm Low voltage, precordial leads Probable anteroseptal infarct, old Confirmed by Gerlene Fee [phone]) on 12/27/2019 1:06:44 AM      Labs Reviewed  CBG MONITORING, ED - Abnormal; Notable for the following components:      Result Value   Glucose-Capillary 145 (*)    All other components within normal limits    CT HEAD WO CONTRAST  Final Result      Medications  acetaminophen (TYLENOL) tablet 1,000 mg (1,000 mg Oral Given 12/27/19 0124)  prochlorperazine (COMPAZINE) tablet 10 mg (10 mg Oral Given 12/27/19 0125)  diphenhydrAMINE (BENADRYL) capsule 25 mg (25 mg Oral Given 12/27/19 0124)     Procedures  /  Critical Care Procedures  ED Course and Medical Decision Making  I have reviewed the triage vital signs, the nursing notes, and pertinent available records from the EMR.  Listed above are laboratory and imaging tests that I personally ordered, reviewed, and interpreted and then considered in my medical decision making (see below for details).  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  History of migraines but these scotomas are new over the past 3 weeks.  Will obtain CT head to exclude any structural abnormalities.  Providing migraine medicine and will reassess.     CT head normal, patient feeling better, appropriate for discharge.  Barth Kirks. Sedonia Small, MD [place] [email or web address]  Final Clinical Impressions(s) / ED Diagnoses     ICD-10-CM   1. 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 Migraine with aura and without status migrainosus, not intractable  G43.109     ED Discharge Orders    None       Discharge Instructions Discussed with and Provided to Patient:     Discharge Instructions     You were evaluated in the Emergency Department and after careful evaluation, we did not find any emergent condition requiring admission or further testing in the hospital.  Your exam/testing today was overall reassuring.  Symptoms seem to be due to a migraine.  We recommend follow-up with your regular doctors to discuss these new symptoms and your medications.  Please return to the Emergency Department if you experience any worsening  of your condition.  Thank you for allowing [place] to be a part of your care.        Maudie Flakes, MD 12/27/19 [phone]

## 2019-12-27 NOTE — Discharge Instructions (Addendum)
You were evaluated in the Emergency Department and after careful evaluation, we did not find any emergent condition requiring admission or further testing in the hospital.  Your exam/testing today was overall reassuring.  Symptoms seem to be due to a migraine.  We recommend follow-up with your regular doctors to discuss these new symptoms and your medications.  Please return to the Emergency Department if you experience any worsening of your condition.  Thank you for allowing [place] to be a part of your care.

## 2020-01-04 ENCOUNTER — Ambulatory Visit: Payer: Self-pay | Admitting: Neurology
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
## 2020-01-06 ENCOUNTER — Ambulatory Visit (INDEPENDENT_AMBULATORY_CARE_PROVIDER_SITE_OTHER): Payer: Medicare Other | Admitting: Neurology

## 2020-01-06 ENCOUNTER — Encounter: Payer: Self-pay | Admitting: Neurology

## 2020-01-06 ENCOUNTER — Other Ambulatory Visit: Payer: Self-pay

## 2020-01-06 VITALS — BP 144/89 | HR 98 | Ht 63.0 in | Wt 189.0 lb

## 2020-01-06 DIAGNOSIS — G40209 Localization-related (focal) (partial) symptomatic epilepsy and epileptic syndromes with complex partial seizures, not intractable, without status epilepticus: Secondary | ICD-10-CM

## 2020-01-06 DIAGNOSIS — G43009 Migraine without aura, not intractable, without status migrainosus: Secondary | ICD-10-CM

## 2020-01-06 MED ORDER — RIZATRIPTAN BENZOATE 10 MG PO TBDP: 10.0000 mg | ORAL_TABLET | ORAL | 11 refills | Status: DC | PRN

## 2020-01-06 MED ORDER — TOPIRAMATE 100 MG PO TABS: 100.0000 mg | ORAL_TABLET | Freq: Two times a day (BID) | ORAL | 3 refills | Status: DC

## 2020-01-06 MED ORDER — LAMOTRIGINE 150 MG PO TABS: ORAL_TABLET | ORAL | 3 refills | Status: DC

## 2020-01-06 NOTE — Patient Instructions (Signed)
Continue current medications Check blood work today Call for worsening headaches or seizures See you back in 4 months
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
## 2020-01-08 LAB — COMPREHENSIVE METABOLIC PANEL
ALT: 24 IU/L (ref 0–32)
AST: 20 IU/L (ref 0–40)
Albumin/Globulin Ratio: 1.6 (ref 1.2–2.2)
Albumin: 4.5 g/dL (ref 3.8–4.9)
Alkaline Phosphatase: 88 IU/L (ref 44–121)
BUN/Creatinine Ratio: 10 (ref 9–23)
BUN: 11 mg/dL (ref 6–24)
Bilirubin Total: 0.3 mg/dL (ref 0.0–1.2)
CO2: 23 mmol/L (ref 20–29)
Calcium: 9.6 mg/dL (ref 8.7–10.2)
Chloride: 104 mmol/L (ref 96–106)
Creatinine, Ser: 1.09 mg/dL — ABNORMAL HIGH (ref 0.57–1.00)
GFR calc Af Amer: 64 mL/min/{1.73_m2} (ref >59)
GFR calc non Af Amer: 56 mL/min/{1.73_m2} — ABNORMAL LOW (ref >59)
Globulin, Total: 2.8 g/dL (ref 1.5–4.5)
Glucose: 121 mg/dL — ABNORMAL HIGH (ref 65–99)
Potassium: 4.7 mmol/L (ref 3.5–5.2)
Sodium: 141 mmol/L (ref 134–144)
Total Protein: 7.3 g/dL (ref 6.0–8.5)

## 2020-01-08 LAB — CBC WITH DIFFERENTIAL/PLATELET
Basophils Absolute: 0.1 10*3/uL (ref 0.0–0.2)
Basos: 1 %
EOS (ABSOLUTE): 0.1 10*3/uL (ref 0.0–0.4)
Eos: 2 %
Hematocrit: 45.2 % (ref 34.0–46.6)
Hemoglobin: 15.2 g/dL (ref 11.1–15.9)
Immature Grans (Abs): 0 10*3/uL (ref 0.0–0.1)
Immature Granulocytes: 0 %
Lymphocytes Absolute: 1.7 10*3/uL (ref 0.7–3.1)
Lymphs: 31 %
MCH: 30.2 pg (ref 26.6–33.0)
MCHC: 33.6 g/dL (ref 31.5–35.7)
MCV: 90 fL (ref 79–97)
Monocytes Absolute: 0.5 10*3/uL (ref 0.1–0.9)
Monocytes: 9 %
Neutrophils Absolute: 3.1 10*3/uL (ref 1.4–7.0)
Neutrophils: 57 %
Platelets: 248 10*3/uL (ref 150–450)
RBC: 5.03 x10E6/uL (ref 3.77–5.28)
RDW: 12.5 % (ref 11.7–15.4)
WBC: 5.4 10*3/uL (ref 3.4–10.8)

## 2020-01-08 LAB — LAMOTRIGINE LEVEL: Lamotrigine Lvl: 2.8 ug/mL (ref 2.0–20.0)

## 2020-02-20 ENCOUNTER — Encounter (INDEPENDENT_AMBULATORY_CARE_PROVIDER_SITE_OTHER): Payer: Self-pay

## 2020-03-03 NOTE — Progress Notes (Signed)
I have reviewed and agreed above plan. 

## 2020-04-13 DIAGNOSIS — R569 Unspecified convulsions: Secondary | ICD-10-CM | POA: Diagnosis not present

## 2020-04-13 DIAGNOSIS — S0990XA Unspecified injury of head, initial encounter: Secondary | ICD-10-CM | POA: Diagnosis not present

## 2020-05-10 ENCOUNTER — Other Ambulatory Visit: Payer: Self-pay

## 2020-05-10 ENCOUNTER — Encounter: Payer: Self-pay | Admitting: Neurology

## 2020-05-10 ENCOUNTER — Ambulatory Visit (INDEPENDENT_AMBULATORY_CARE_PROVIDER_SITE_OTHER): Payer: Medicare Other | Admitting: Neurology

## 2020-05-10 VITALS — BP 126/81 | HR 90 | Ht 63.0 in | Wt 185.0 lb

## 2020-05-10 DIAGNOSIS — G40909 Epilepsy, unspecified, not intractable, without status epilepticus: Secondary | ICD-10-CM | POA: Diagnosis not present

## 2020-05-10 DIAGNOSIS — G43009 Migraine without aura, not intractable, without status migrainosus: Secondary | ICD-10-CM | POA: Diagnosis not present

## 2020-05-10 NOTE — Patient Instructions (Signed)
Please take medications as prescribed Take maxalt at onset of headache  Can see the eye doctor Call for worsening spells See you back in 6 months
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
## 2020-05-16 ENCOUNTER — Telehealth: Payer: Self-pay | Admitting: Endocrinology

## 2020-05-16 NOTE — Telephone Encounter (Signed)
Patient called to advise that she is having a lot of swelling in her throat and aching in jaw and throat.  Neck is aching and has a weird headache.  Is not feeling well. Is requesting a call back to [phone]

## 2020-05-16 NOTE — Telephone Encounter (Signed)
Spoke with pt and she stated that she has some swelling going on for the past couple of days in her throat along with some aching jaws and a  Headache.  Please Advise

## 2020-05-17 ENCOUNTER — Other Ambulatory Visit: Payer: Self-pay | Admitting: Family Medicine

## 2020-05-17 DIAGNOSIS — Z1231 Encounter for screening mammogram for malignant neoplasm of breast: Secondary | ICD-10-CM

## 2020-05-17 NOTE — Telephone Encounter (Signed)
Spoke with pt to let her know that she needs to F/U with her PCP bc the issues she was having does not seem like a thyroid issue. She understood and will contact her PCP

## 2020-07-05 ENCOUNTER — Other Ambulatory Visit: Payer: Self-pay

## 2020-07-05 ENCOUNTER — Ambulatory Visit
Admission: RE | Admit: 2020-07-05 | Discharge: 2020-07-05 | Disposition: A | Discharge status: Home / Self Care | Payer: Medicare Other | Source: Ambulatory Visit | Attending: Family Medicine | Admitting: Family Medicine

## 2020-07-05 DIAGNOSIS — Z1231 Encounter for screening mammogram for malignant neoplasm of breast: Secondary | ICD-10-CM

## 2020-07-14 DIAGNOSIS — L905 Scar conditions and fibrosis of skin: Secondary | ICD-10-CM | POA: Diagnosis not present

## 2020-07-14 DIAGNOSIS — L811 Chloasma: Secondary | ICD-10-CM | POA: Diagnosis not present

## 2020-07-14 DIAGNOSIS — D235 Other benign neoplasm of skin of trunk: Secondary | ICD-10-CM | POA: Diagnosis not present

## 2020-10-19 ENCOUNTER — Ambulatory Visit (INDEPENDENT_AMBULATORY_CARE_PROVIDER_SITE_OTHER): Payer: Medicare Other | Admitting: Endocrinology

## 2020-10-19 ENCOUNTER — Other Ambulatory Visit: Payer: Self-pay

## 2020-10-19 VITALS — BP 140/84 | HR 74 | Ht 63.0 in | Wt 187.0 lb

## 2020-10-19 DIAGNOSIS — E042 Nontoxic multinodular goiter: Secondary | ICD-10-CM | POA: Diagnosis not present

## 2020-10-19 DIAGNOSIS — R635 Abnormal weight gain: Secondary | ICD-10-CM

## 2020-10-19 DIAGNOSIS — E890 Postprocedural hypothyroidism: Secondary | ICD-10-CM | POA: Diagnosis not present

## 2020-10-19 NOTE — Patient Instructions (Addendum)
Please do the thyroid blood test when you see Dr Dorthy Cooler soon.  Let's recheck the ultrasound.  you will receive a phone call, about a day and time for an appointment.   Please see a weight loss specialist.  you will receive a phone call, about a day and time for an appointment. Please come back for a follow-up appointment in 1 year.

## 2020-10-19 NOTE — Progress Notes (Signed)
Subjective:    Patient ID: Erica Meyers, female    DOB: 17-Oct-1960, 60 y.o.   MRN: [id number]  HPI Pt returns for f/u of post-RAI hypothyroidism (in 2002, she was dx'ed with hyperthyroidism, due to [place]; she had RAI in June, 2018; she started synthroid in June, 2019; bx showed SCANT FOLLICULAR EPITHELIUM PRESENT ([place] I); f/u US in 2019 showed Slight interval growth of nodule within left lobe , which now meets imaging criteria to recommend bx.  Despite reduction in size, nodule #1 within the right lobe of the thyroid continues to meet imaging criteria to recommend bx--which in 2019 was cat 1; [place] in 2020 showed shrinkage of the nodules).  She reports weight gain.   Past Medical History:  Diagnosis Date   Anemia    Depression    Diabetes mellitus without complication ([facility])    Dyslipidemia    Epilepsy ([place])    Headache    Hypertension    Thyroid disease     Past Surgical History:  Procedure Laterality Date   CESAREAN SECTION     TONSILLECTOMY     TUBAL LIGATION     VEIN LIGATION AND STRIPPING Left     Social History   Socioeconomic History   Marital status: Divorced    Spouse name: Not on file   Number of children: Not on file   Years of education: Not on file   Highest education level: Not on file  Occupational History   Not on file  Tobacco Use   Smoking status: Never   Smokeless tobacco: Never  Vaping Use   Vaping Use: Never used  Substance and Sexual Activity   Alcohol use: No   Drug use: No   Sexual activity: Not on file  Other Topics Concern   Not on file  Social History Narrative   Not on file   Social Determinants of Health   Financial Resource Strain: Not on file  Food Insecurity: Not on file  Transportation Needs: Not on file  Physical Activity: Not on file  Stress: Not on file  Social Connections: Not on file  Intimate Partner Violence: Not on file    Current Outpatient Medications on File Prior to Visit  Medication Sig Dispense  Refill   atorvastatin (LIPITOR) 20 MG tablet Take 20 mg by mouth daily.     cholecalciferol (VITAMIN D) 1000 UNITS tablet Take 1,000 Units by mouth daily.     Desvenlafaxine Succinate ER 25 MG TB24      lamoTRIgine (LAMICTAL) 150 MG tablet TAKE 2 TABLETS BY MOUTH TWICE A DAY 360 tablet 3   levothyroxine (SYNTHROID) 75 MCG tablet Take 1 tablet (75 mcg total) by mouth daily before breakfast. 90 tablet 3   lisinopril-hydrochlorothiazide (PRINZIDE,ZESTORETIC) 20-25 MG tablet TAKE 1 TABLET BY MOUTH ONCE A DAY (WILL PAY 3/14)  11   meloxicam (MOBIC) 7.5 MG tablet Take 7.5 mg by mouth daily.     Multiple Vitamin (MULTIVITAMIN WITH MINERALS) TABS tablet Take 1 tablet by mouth daily.     rizatriptan (MAXALT-MLT) 10 MG disintegrating tablet Take 1 tablet (10 mg total) by mouth as needed for migraine. May repeat in 2 hours if needed 12 tablet 11   topiramate (TOPAMAX) 100 MG tablet Take 1 tablet (100 mg total) by mouth 2 (two) times daily. 180 tablet 3   No current facility-administered medications on file prior to visit.    Allergies  Allergen Reactions   Penicillins     Childhood, does  not recall reaction.    Family History  Problem Relation Age of Onset   Hypertension Mother    Stroke Mother    Thyroid disease Mother     BP 140/84 (BP Location: Right Arm, Patient Position: Sitting, Cuff Size: Normal)   Pulse 74   Ht 5\' 3"  (1.6 m)   Wt 187 lb (84.8 kg)   LMP 01/05/2016   SpO2 95%   BMI 33.13 kg/m    Review of Systems     Objective:   Physical Exam VITAL SIGNS:  See vs page GENERAL: no distress NECK: thyroid is slightly enlarged (R>L), but no palpable nodule  outside test results are reviewed: TSH=normal in 2021     Assessment & Plan:  Obesity: uncontrolled.  MNG, due for recheck.  Hypothyroidism: Please continue the same synthroid pending labs.   Patient Instructions  Please do the thyroid blood test when you see Dr Dorthy Cooler soon.  Let's recheck the ultrasound.  you  will receive a phone call, about a day and time for an appointment.   Please see a weight loss specialist.  you will receive a phone call, about a day and time for an appointment. Please come back for a follow-up appointment in 1 year.

## 2020-10-26 ENCOUNTER — Other Ambulatory Visit: Payer: Medicare Other

## 2020-11-15 ENCOUNTER — Telehealth (INDEPENDENT_AMBULATORY_CARE_PROVIDER_SITE_OTHER): Payer: Medicare Other | Admitting: Neurology

## 2020-11-15 DIAGNOSIS — G40909 Epilepsy, unspecified, not intractable, without status epilepticus: Secondary | ICD-10-CM | POA: Diagnosis not present

## 2020-11-15 DIAGNOSIS — G43009 Migraine without aura, not intractable, without status migrainosus: Secondary | ICD-10-CM

## 2020-11-15 DIAGNOSIS — E039 Hypothyroidism, unspecified: Secondary | ICD-10-CM | POA: Diagnosis not present

## 2020-11-15 DIAGNOSIS — R519 Headache, unspecified: Secondary | ICD-10-CM | POA: Diagnosis not present

## 2020-11-15 DIAGNOSIS — M791 Myalgia, unspecified site: Secondary | ICD-10-CM | POA: Diagnosis not present

## 2020-11-15 DIAGNOSIS — E119 Type 2 diabetes mellitus without complications: Secondary | ICD-10-CM | POA: Diagnosis not present

## 2020-11-15 MED ORDER — RIZATRIPTAN BENZOATE 10 MG PO TBDP: 10.0000 mg | ORAL_TABLET | ORAL | 11 refills | Status: AC | PRN

## 2020-11-15 MED ORDER — TOPIRAMATE 100 MG PO TABS
100.0000 mg | ORAL_TABLET | Freq: Two times a day (BID) | ORAL | 3 refills | Status: DC
Start: 2020-11-15 — End: 2021-12-19

## 2020-11-15 MED ORDER — LAMOTRIGINE 150 MG PO TABS
ORAL_TABLET | ORAL | 3 refills | Status: DC
Start: 2020-11-15 — End: 2021-12-19
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
## 2020-11-16 ENCOUNTER — Encounter: Payer: Self-pay | Admitting: Neurology

## 2020-11-16 ENCOUNTER — Ambulatory Visit
Admission: RE | Admit: 2020-11-16 | Discharge: 2020-11-16 | Disposition: A | Discharge status: Home / Self Care | Payer: Medicare Other | Source: Ambulatory Visit | Attending: Endocrinology | Admitting: Endocrinology

## 2020-11-16 DIAGNOSIS — E042 Nontoxic multinodular goiter: Secondary | ICD-10-CM

## 2020-11-17 DIAGNOSIS — E039 Hypothyroidism, unspecified: Secondary | ICD-10-CM | POA: Diagnosis not present

## 2020-11-17 DIAGNOSIS — M791 Myalgia, unspecified site: Secondary | ICD-10-CM | POA: Diagnosis not present

## 2020-11-17 DIAGNOSIS — E119 Type 2 diabetes mellitus without complications: Secondary | ICD-10-CM | POA: Diagnosis not present

## 2020-11-17 DIAGNOSIS — R519 Headache, unspecified: Secondary | ICD-10-CM | POA: Diagnosis not present

## 2020-11-29 DIAGNOSIS — E119 Type 2 diabetes mellitus without complications: Secondary | ICD-10-CM | POA: Diagnosis not present

## 2020-11-29 DIAGNOSIS — Z23 Encounter for immunization: Secondary | ICD-10-CM | POA: Diagnosis not present

## 2020-11-29 DIAGNOSIS — I10 Essential (primary) hypertension: Secondary | ICD-10-CM | POA: Diagnosis not present

## 2020-12-08 ENCOUNTER — Other Ambulatory Visit: Payer: Self-pay | Admitting: Endocrinology

## 2020-12-08 DIAGNOSIS — E890 Postprocedural hypothyroidism: Secondary | ICD-10-CM

## 2021-01-06 DIAGNOSIS — I10 Essential (primary) hypertension: Secondary | ICD-10-CM | POA: Diagnosis not present

## 2021-01-06 DIAGNOSIS — D539 Nutritional anemia, unspecified: Secondary | ICD-10-CM | POA: Diagnosis not present

## 2021-01-06 DIAGNOSIS — E119 Type 2 diabetes mellitus without complications: Secondary | ICD-10-CM | POA: Diagnosis not present

## 2021-01-06 DIAGNOSIS — R0602 Shortness of breath: Secondary | ICD-10-CM | POA: Diagnosis not present

## 2021-01-06 DIAGNOSIS — E559 Vitamin D deficiency, unspecified: Secondary | ICD-10-CM | POA: Diagnosis not present

## 2021-01-06 DIAGNOSIS — R5383 Other fatigue: Secondary | ICD-10-CM | POA: Diagnosis not present

## 2021-01-06 DIAGNOSIS — Z79899 Other long term (current) drug therapy: Secondary | ICD-10-CM | POA: Diagnosis not present

## 2021-01-06 DIAGNOSIS — Z1159 Encounter for screening for other viral diseases: Secondary | ICD-10-CM | POA: Diagnosis not present

## 2021-01-06 DIAGNOSIS — E349 Endocrine disorder, unspecified: Secondary | ICD-10-CM | POA: Diagnosis not present

## 2021-01-06 DIAGNOSIS — E7800 Pure hypercholesterolemia, unspecified: Secondary | ICD-10-CM | POA: Diagnosis not present

## 2021-03-15 DIAGNOSIS — E349 Endocrine disorder, unspecified: Secondary | ICD-10-CM | POA: Diagnosis not present

## 2021-03-16 DIAGNOSIS — E119 Type 2 diabetes mellitus without complications: Secondary | ICD-10-CM | POA: Diagnosis not present

## 2021-04-13 DIAGNOSIS — I10 Essential (primary) hypertension: Secondary | ICD-10-CM | POA: Diagnosis not present

## 2021-04-13 DIAGNOSIS — E1169 Type 2 diabetes mellitus with other specified complication: Secondary | ICD-10-CM | POA: Diagnosis not present

## 2021-04-13 DIAGNOSIS — Z0000 Encounter for general adult medical examination without abnormal findings: Secondary | ICD-10-CM | POA: Diagnosis not present

## 2021-04-19 DIAGNOSIS — E349 Endocrine disorder, unspecified: Secondary | ICD-10-CM | POA: Diagnosis not present

## 2021-05-04 ENCOUNTER — Ambulatory Visit (INDEPENDENT_AMBULATORY_CARE_PROVIDER_SITE_OTHER): Payer: Medicare Other | Admitting: Endocrinology

## 2021-05-04 ENCOUNTER — Encounter: Payer: Self-pay | Admitting: Endocrinology

## 2021-05-04 VITALS — BP 130/86 | HR 90 | Ht 63.0 in | Wt 178.8 lb

## 2021-05-04 DIAGNOSIS — E042 Nontoxic multinodular goiter: Secondary | ICD-10-CM | POA: Diagnosis not present

## 2021-05-04 DIAGNOSIS — E890 Postprocedural hypothyroidism: Secondary | ICD-10-CM | POA: Diagnosis not present

## 2021-05-04 LAB — TSH: TSH: 5.64 u[IU]/mL — ABNORMAL HIGH (ref 0.35–5.50)

## 2021-05-04 LAB — T4, FREE: Free T4: 0.93 ng/dL (ref 0.60–1.60)

## 2021-05-04 MED ORDER — LEVOTHYROXINE SODIUM 88 MCG PO TABS: 88.0000 ug | ORAL_TABLET | Freq: Every day | ORAL | 3 refills | Status: DC

## 2021-05-04 NOTE — Patient Instructions (Addendum)
Blood tests are requested for you today.  We'll let you know about the results.   ?Let's recheck the ultrasound.  you will receive a phone call, about a day and time for an appointment.    ?You should have a follow-up appointment in 1 year.   ?

## 2021-05-04 NOTE — Progress Notes (Signed)
? ?Subjective:  ? ? Patient ID: Erica Meyers, female    DOB: 1960/04/16, 61 y.o.   MRN: [id number] ? ?HPI ?Pt returns for f/u of post-RAI hypothyroidism (in 2002, she was dx'ed with hyperthyroidism, due to [place]; she had RAI in June, 2018; she started synthroid in June, 2019; bx showed SCANT FOLLICULAR EPITHELIUM PRESENT ([place] I); f/u US in 2019 showed Slight interval growth of nodule within left lobe , which now meets imaging criteria to recommend bx.  Despite reduction in size, nodule #1 within the right lobe of the thyroid continues to meet imaging criteria to recommend bx--which in 2019 was cat 1; [place] in 2022 advised 1 year f/u).  She again reports weight gain.   ?Past Medical History:  ?Diagnosis Date  ? Anemia   ? Depression   ? Diabetes mellitus without complication ([place])   ? Dyslipidemia   ? Epilepsy ([place])   ? Headache   ? Hypertension   ? Thyroid disease   ? ? ?Past Surgical History:  ?Procedure Laterality Date  ? CESAREAN SECTION    ? TONSILLECTOMY    ? TUBAL LIGATION    ? VEIN LIGATION AND STRIPPING Left   ? ? ?Social History  ? ?Socioeconomic History  ? Marital status: Divorced  ?  Spouse name: Not on file  ? Number of children: Not on file  ? Years of education: Not on file  ? Highest education level: Not on file  ?Occupational History  ? Not on file  ?Tobacco Use  ? Smoking status: Never  ? Smokeless tobacco: Never  ?Vaping Use  ? Vaping Use: Never used  ?Substance and Sexual Activity  ? Alcohol use: No  ? Drug use: No  ? Sexual activity: Not on file  ?Other Topics Concern  ? Not on file  ?Social History Narrative  ? Not on file  ? ?Social Determinants of Health  ? ?Financial Resource Strain: Not on file  ?Food Insecurity: Not on file  ?Transportation Needs: Not on file  ?Physical Activity: Not on file  ?Stress: Not on file  ?Social Connections: Not on file  ?Intimate Partner Violence: Not on file  ? ? ?Current Outpatient Medications on File Prior to Visit  ?Medication Sig Dispense Refill   ? atorvastatin (LIPITOR) 20 MG tablet Take 20 mg by mouth daily.    ? cholecalciferol (VITAMIN D) 1000 UNITS tablet Take 1,000 Units by mouth daily.    ? Desvenlafaxine Succinate ER 25 MG TB24     ? lamoTRIgine (LAMICTAL) 150 MG tablet TAKE 2 TABLETS BY MOUTH TWICE A DAY 360 tablet 3  ? lisinopril-hydrochlorothiazide (PRINZIDE,ZESTORETIC) 20-25 MG tablet TAKE 1 TABLET BY MOUTH ONCE A DAY (WILL PAY 3/14)  11  ? meloxicam (MOBIC) 7.5 MG tablet Take 7.5 mg by mouth daily.    ? Multiple Vitamin (MULTIVITAMIN WITH MINERALS) TABS tablet Take 1 tablet by mouth daily.    ? rizatriptan (MAXALT-MLT) 10 MG disintegrating tablet Take 1 tablet (10 mg total) by mouth as needed for migraine. May repeat in 2 hours if needed 12 tablet 11  ? topiramate (TOPAMAX) 100 MG tablet Take 1 tablet (100 mg total) by mouth 2 (two) times daily. 180 tablet 3  ? ?No current facility-administered medications on file prior to visit.  ? ? ?Allergies  ?Allergen Reactions  ? Penicillins   ?  Childhood, does not recall reaction.  ? ? ?Family History  ?Problem Relation Age of Onset  ? Hypertension Mother   ?  Stroke Mother   ? Thyroid disease Mother   ? ? ?BP 130/86 (BP Location: Left Arm, Patient Position: Sitting, Cuff Size: Normal)   Pulse 90   Ht '5\' 3"'$  (1.6 m)   Wt 178 lb 12.8 oz (81.1 kg)   LMP 01/05/2016   SpO2 97%   BMI 31.67 kg/m?  ? ? ?Review of Systems ? ?   ?Objective:  ? Physical Exam ?VITAL SIGNS:  See vs page.   ?GENERAL: no distress.   ?NECK: There is no palpable thyroid enlargement.  No thyroid nodule is palpable.  No palpable lymphadenopathy at the anterior neck.  ? ? ?Lab Results  ?Component Value Date  ? TSH 5.64 (H) 05/04/2021  ? ?   ?Assessment & Plan:  ?Hypothyroidism: uncontrolled.  I have sent a prescription to your pharmacy, to increase the levothyroxine.   ? ?

## 2021-05-10 ENCOUNTER — Ambulatory Visit
Admission: RE | Admit: 2021-05-10 | Discharge: 2021-05-10 | Disposition: A | Discharge status: Home / Self Care | Payer: Medicare Other | Source: Ambulatory Visit | Attending: Endocrinology | Admitting: Endocrinology

## 2021-05-10 DIAGNOSIS — E041 Nontoxic single thyroid nodule: Secondary | ICD-10-CM | POA: Diagnosis not present

## 2021-05-10 DIAGNOSIS — E042 Nontoxic multinodular goiter: Secondary | ICD-10-CM

## 2021-05-29 DIAGNOSIS — I10 Essential (primary) hypertension: Secondary | ICD-10-CM | POA: Diagnosis not present

## 2021-05-29 DIAGNOSIS — E1169 Type 2 diabetes mellitus with other specified complication: Secondary | ICD-10-CM | POA: Diagnosis not present

## 2021-06-27 DIAGNOSIS — I10 Essential (primary) hypertension: Secondary | ICD-10-CM | POA: Diagnosis not present

## 2021-06-27 DIAGNOSIS — E1169 Type 2 diabetes mellitus with other specified complication: Secondary | ICD-10-CM | POA: Diagnosis not present

## 2021-09-06 ENCOUNTER — Encounter (INDEPENDENT_AMBULATORY_CARE_PROVIDER_SITE_OTHER): Payer: Self-pay

## 2021-09-20 DIAGNOSIS — E039 Hypothyroidism, unspecified: Secondary | ICD-10-CM | POA: Diagnosis not present

## 2021-09-20 DIAGNOSIS — I10 Essential (primary) hypertension: Secondary | ICD-10-CM | POA: Diagnosis not present

## 2021-09-20 DIAGNOSIS — E1169 Type 2 diabetes mellitus with other specified complication: Secondary | ICD-10-CM | POA: Diagnosis not present

## 2021-09-20 DIAGNOSIS — E7800 Pure hypercholesterolemia, unspecified: Secondary | ICD-10-CM | POA: Diagnosis not present

## 2021-10-11 ENCOUNTER — Telehealth: Payer: Self-pay | Admitting: Cardiovascular Disease

## 2021-10-11 NOTE — Telephone Encounter (Signed)
Spoke with pt and today had a couple of episodes of neck pain that radiated down to chest left sided  "Squeezing sensation " B/P and HR rate good  Also felt sweaty but thinks it was related to anxiety  Pt also took Baby ASA because of being scared no other symptoms and per pt pain free for the last hour Appt made with Dr Johnsie Cancel for 09/18/21 at 1:45 pm  and instructed pt to go to ED if develops any new or more S/S Will forward to Dr Johnsie Cancel for review .Adonis Housekeeper

## 2021-10-11 NOTE — Telephone Encounter (Signed)
Pt c/o of Chest Pain: STAT if CP now or developed within 24 hours  1. Are you having CP right now? No, but this morning. Pain started in left side of neck that has gone down to chest.   2. Are you experiencing any other symptoms (ex. SOB, nausea, vomiting, sweating)? Sweating  3. How long have you been experiencing CP? This morning   4. Is your CP continuous or coming and going? Coming and going   5. Have you taken Nitroglycerin? No  ?

## 2021-10-16 NOTE — Telephone Encounter (Signed)
Left message for patient to call back  

## 2021-10-18 NOTE — Telephone Encounter (Signed)
Left message for patient to call back  

## 2021-10-19 ENCOUNTER — Encounter: Payer: Self-pay | Admitting: Cardiovascular Disease

## 2021-10-19 ENCOUNTER — Ambulatory Visit: Payer: Medicare Other | Admitting: Endocrinology

## 2021-10-19 ENCOUNTER — Ambulatory Visit: Payer: Medicare Other | Attending: Cardiovascular Disease | Admitting: Cardiovascular Disease

## 2021-10-19 ENCOUNTER — Ambulatory Visit: Payer: Medicare Other | Admitting: Cardiovascular Disease

## 2021-10-19 VITALS — BP 138/96 | HR 77 | Ht 63.0 in | Wt 173.0 lb

## 2021-10-19 DIAGNOSIS — I10 Essential (primary) hypertension: Secondary | ICD-10-CM | POA: Diagnosis not present

## 2021-10-19 DIAGNOSIS — E559 Vitamin D deficiency, unspecified: Secondary | ICD-10-CM | POA: Diagnosis not present

## 2021-10-19 DIAGNOSIS — R079 Chest pain, unspecified: Secondary | ICD-10-CM

## 2021-10-19 DIAGNOSIS — E1169 Type 2 diabetes mellitus with other specified complication: Secondary | ICD-10-CM | POA: Diagnosis not present

## 2021-10-19 DIAGNOSIS — R7989 Other specified abnormal findings of blood chemistry: Secondary | ICD-10-CM | POA: Diagnosis not present

## 2021-10-19 NOTE — Telephone Encounter (Signed)
Patient had office visit today.

## 2021-10-19 NOTE — Patient Instructions (Signed)
Medication Instructions:  Your physician recommends that you continue on your current medications as directed. Please refer to the Current Medication list given to you today.  *If you need a refill on your cardiac medications before your next appointment, please call your pharmacy*  Lab Work: If you have labs (blood work) drawn today and your tests are completely normal, you will receive your results only by: [place] (if you have MyChart) OR A paper copy in the mail If you have any lab test that is abnormal or we need to change your treatment, we will call you to review the results.  Testing/Procedures Non-Cardiac CT scanning for calcium score, (CAT scanning), is a noninvasive, special x-ray that produces cross-sectional images of the body using x-rays and a computer. CT scans help physicians diagnose and treat medical conditions. For some CT exams, a contrast material is used to enhance visibility in the area of the body being studied. CT scans provide greater clarity and reveal more details than regular x-ray exams.  Follow-Up: At [facility], you and your health needs are our priority.  As part of our continuing mission to provide you with exceptional heart care, we have created designated Provider Care Teams.  These Care Teams include your primary Cardiologist (physician) and Advanced Practice Providers (APPs -  Physician Assistants and Nurse Practitioners) who all work together to provide you with the care you need, when you need it.  We recommend signing up for the patient portal called "MyChart".  Sign up information is provided on this After Visit Summary.  MyChart is used to connect with patients for Virtual Visits (Telemedicine).  Patients are able to view lab/test results, encounter notes, upcoming appointments, etc.  Non-urgent messages can be sent to your provider as well.   To learn more about what you can do with MyChart, go to [email or web address].    Your next  appointment:   12 month(s)  The format for your next appointment:   In Person  Provider:   Jenkins Rouge, MD     Important Information About Sugar

## 2021-10-19 NOTE — Progress Notes (Signed)
Evaluation Performed: New  Date:  10/19/2021   ID:  Catha Brow, DOB Sep 25, 1960, MRN [id number]   PCP:  Lujean Amel, MD  Cardiologist:  New/Yasenia Reedy Electrophysiologist:  None   Chief Complaint:  Chest Pain  Referred by Dr Dorthy Cooler   History of Present Illness:    Erica Meyers is a 61 y.o. female with HTN, Seizures, and migraines Has not been seen in 3 years Seen in ER 04/27/18 for chest pain. Persistent for 5-6 days Atypical sharp pain in upper left chest Also with some left sided neck pain like she slept poorly on it R/O normal CXR and ECG D/C home with diagnosis of muscular pain. To f/u with primary who referred her to cardiology for stress testing   She has severe anxiety and depression Was teary eyed during that encounter Has two grandchildren living with her now and they may have autism. She indicates that her kids don't listen to her advice She is about to see a therapist soon. Still with occasional exertional pain in chest since d/c   ETT done 05/19/19 and normal   Called office 09/1321 with atypical sharp left sided neck and arm pain   She lives with her daughter and 3 grand kids Son in [place] with 2 more grand kids She is not working Going to [employer] loss program and taking Ozempic    Past Medical History:  Diagnosis Date   Anemia    Depression    Diabetes mellitus without complication ([place])    Dyslipidemia    Epilepsy ([place])    Headache    Hypertension    Thyroid disease    Past Surgical History:  Procedure Laterality Date   CESAREAN SECTION     TONSILLECTOMY     TUBAL LIGATION     VEIN LIGATION AND STRIPPING Left      Current Meds  Medication Sig   atorvastatin (LIPITOR) 20 MG tablet Take 20 mg by mouth daily.   cholecalciferol (VITAMIN D) 1000 UNITS tablet Take 1,000 Units by mouth daily.   Desvenlafaxine Succinate ER 25 MG TB24    lamoTRIgine (LAMICTAL) 150 MG tablet TAKE 2 TABLETS BY MOUTH TWICE A DAY   levothyroxine (SYNTHROID) 88 MCG  tablet Take 1 tablet (88 mcg total) by mouth daily.   lisinopril-hydrochlorothiazide (PRINZIDE,ZESTORETIC) 20-25 MG tablet TAKE 1 TABLET BY MOUTH ONCE A DAY (WILL PAY 3/14)   meloxicam (MOBIC) 7.5 MG tablet Take 7.5 mg by mouth daily.   Multiple Vitamin (MULTIVITAMIN WITH MINERALS) TABS tablet Take 1 tablet by mouth daily.   rizatriptan (MAXALT-MLT) 10 MG disintegrating tablet Take 1 tablet (10 mg total) by mouth as needed for migraine. May repeat in 2 hours if needed   topiramate (TOPAMAX) 100 MG tablet Take 1 tablet (100 mg total) by mouth 2 (two) times daily.     Allergies:   Penicillins   Social History   Tobacco Use   Smoking status: Never   Smokeless tobacco: Never  Vaping Use   Vaping Use: Never used  Substance Use Topics   Alcohol use: No   Drug use: No     Family Hx: The patient's family history includes Hypertension in her mother; Stroke in her mother; Thyroid disease in her mother.  ROS:   Please see the history of present illness.     All other systems reviewed and are negative.   Prior CV studies:   The following studies were reviewed today:  ETT  Labs/Other Tests and Data  Reviewed:    EKG:   NSR normal ECG 10/19/2021 NSR rate 77 poor R wave progression   Recent Labs: 05/04/2021: TSH 5.64   Recent Lipid Panel No results found for: "CHOL", "TRIG", "HDL", "CHOLHDL", "LDLCALC", "LDLDIRECT"  Wt Readings from Last 3 Encounters:  10/19/21 173 lb (78.5 kg)  05/04/21 178 lb 12.8 oz (81.1 kg)  10/19/20 187 lb (84.8 kg)     Objective:    Vital Signs:  BP (!) 138/96   Pulse 77   Ht '5\' 3"'$  (1.6 m)   Wt 173 lb (78.5 kg)   LMP 01/05/2016   SpO2 98%   BMI 30.65 kg/m    Affect appropriate Healthy:  appears stated age 38: multinodular goiter  Neck supple with no adenopathy JVP normal no bruits no thyromegaly Lungs clear with no wheezing and good diaphragmatic motion Heart:  S1/S2 no murmur, no rub, gallop or click PMI normal Abdomen: benighn, BS  positve, no tenderness, no AAA no bruit.  No HSM or HJR Distal pulses intact with no bruits No edema Neuro non-focal Skin warm and dry No muscular weakness   ASSESSMENT & PLAN:    Chest Pain : atypical with ETT 05/19/19 Normal Recurrent discussed utility of calcium score to risk stratify  HLD:  On statin labs with primary  DM:  Discussed low carb diet.  Target hemoglobin A1c is 6.5 or less.  Continue current medications. HTN:  Well controlled.  Continue current medications and low sodium Dash type diet.   Seizures:  Quiescent continue Lamictal Thyroid:  Continue replacement TSH normal  Anxiety/Depression:  Major issue seeing therapist   Calcium score    Disposition:  Follow up in a year   Signed, Jenkins Rouge, MD  10/19/2021 2:24 PM    [place]

## 2021-11-08 DIAGNOSIS — E7800 Pure hypercholesterolemia, unspecified: Secondary | ICD-10-CM | POA: Diagnosis not present

## 2021-11-08 DIAGNOSIS — E1169 Type 2 diabetes mellitus with other specified complication: Secondary | ICD-10-CM | POA: Diagnosis not present

## 2021-11-08 DIAGNOSIS — Z23 Encounter for immunization: Secondary | ICD-10-CM | POA: Diagnosis not present

## 2021-11-27 ENCOUNTER — Ambulatory Visit (HOSPITAL_BASED_OUTPATIENT_CLINIC_OR_DEPARTMENT_OTHER): Payer: Medicare Other | Attending: Cardiovascular Disease

## 2021-12-08 ENCOUNTER — Ambulatory Visit: Payer: Medicare Other | Admitting: Cardiovascular Disease

## 2021-12-11 ENCOUNTER — Other Ambulatory Visit: Payer: Self-pay

## 2021-12-11 MED ORDER — LEVOTHYROXINE SODIUM 88 MCG PO TABS: 88.0000 ug | ORAL_TABLET | Freq: Every day | ORAL | 0 refills | Status: DC

## 2021-12-14 ENCOUNTER — Other Ambulatory Visit: Payer: Self-pay

## 2021-12-14 MED ORDER — LEVOTHYROXINE SODIUM 88 MCG PO TABS: 88.0000 ug | ORAL_TABLET | Freq: Every day | ORAL | 0 refills | Status: DC

## 2021-12-19 ENCOUNTER — Other Ambulatory Visit: Payer: Self-pay | Admitting: Neurology

## 2021-12-20 DIAGNOSIS — R238 Other skin changes: Secondary | ICD-10-CM | POA: Diagnosis not present

## 2022-01-07 ENCOUNTER — Other Ambulatory Visit: Payer: Self-pay | Admitting: Neurology

## 2022-01-08 ENCOUNTER — Other Ambulatory Visit: Payer: Self-pay

## 2022-03-23 DIAGNOSIS — D84821 Immunodeficiency due to drugs: Secondary | ICD-10-CM | POA: Diagnosis not present

## 2022-03-23 DIAGNOSIS — I10 Essential (primary) hypertension: Secondary | ICD-10-CM | POA: Diagnosis not present

## 2022-03-23 DIAGNOSIS — Z0001 Encounter for general adult medical examination with abnormal findings: Secondary | ICD-10-CM | POA: Diagnosis not present

## 2022-03-23 DIAGNOSIS — N1831 Chronic kidney disease, stage 3a: Secondary | ICD-10-CM | POA: Diagnosis not present

## 2022-03-23 DIAGNOSIS — E7800 Pure hypercholesterolemia, unspecified: Secondary | ICD-10-CM | POA: Diagnosis not present

## 2022-03-23 DIAGNOSIS — E039 Hypothyroidism, unspecified: Secondary | ICD-10-CM | POA: Diagnosis not present

## 2022-03-23 DIAGNOSIS — Z79899 Other long term (current) drug therapy: Secondary | ICD-10-CM | POA: Diagnosis not present

## 2022-03-23 DIAGNOSIS — E1122 Type 2 diabetes mellitus with diabetic chronic kidney disease: Secondary | ICD-10-CM | POA: Diagnosis not present

## 2022-04-24 ENCOUNTER — Other Ambulatory Visit: Payer: Self-pay

## 2022-07-29 ENCOUNTER — Other Ambulatory Visit: Payer: Self-pay | Admitting: Internal Medicine

## 2022-08-01 DIAGNOSIS — E559 Vitamin D deficiency, unspecified: Secondary | ICD-10-CM | POA: Diagnosis not present

## 2022-08-01 DIAGNOSIS — E7800 Pure hypercholesterolemia, unspecified: Secondary | ICD-10-CM | POA: Diagnosis not present

## 2022-08-01 DIAGNOSIS — D539 Nutritional anemia, unspecified: Secondary | ICD-10-CM | POA: Diagnosis not present

## 2022-08-01 DIAGNOSIS — R7989 Other specified abnormal findings of blood chemistry: Secondary | ICD-10-CM | POA: Diagnosis not present

## 2022-08-01 DIAGNOSIS — I10 Essential (primary) hypertension: Secondary | ICD-10-CM | POA: Diagnosis not present

## 2022-08-01 DIAGNOSIS — R5383 Other fatigue: Secondary | ICD-10-CM | POA: Diagnosis not present

## 2022-08-01 DIAGNOSIS — E88819 Insulin resistance, unspecified: Secondary | ICD-10-CM | POA: Diagnosis not present

## 2022-08-01 DIAGNOSIS — Z79899 Other long term (current) drug therapy: Secondary | ICD-10-CM | POA: Diagnosis not present

## 2022-08-01 DIAGNOSIS — E1169 Type 2 diabetes mellitus with other specified complication: Secondary | ICD-10-CM | POA: Diagnosis not present

## 2022-08-01 DIAGNOSIS — Z0000 Encounter for general adult medical examination without abnormal findings: Secondary | ICD-10-CM | POA: Diagnosis not present

## 2022-08-21 ENCOUNTER — Other Ambulatory Visit (HOSPITAL_COMMUNITY): Admission: RE | Admit: 2022-08-21 | Payer: 59 | Source: Ambulatory Visit

## 2022-08-21 ENCOUNTER — Other Ambulatory Visit: Payer: Self-pay | Admitting: Family Medicine

## 2022-08-21 DIAGNOSIS — Z1151 Encounter for screening for human papillomavirus (HPV): Secondary | ICD-10-CM | POA: Diagnosis not present

## 2022-08-21 DIAGNOSIS — Z01411 Encounter for gynecological examination (general) (routine) with abnormal findings: Secondary | ICD-10-CM | POA: Diagnosis present

## 2022-08-21 DIAGNOSIS — N1831 Chronic kidney disease, stage 3a: Secondary | ICD-10-CM | POA: Diagnosis not present

## 2022-08-21 DIAGNOSIS — Z23 Encounter for immunization: Secondary | ICD-10-CM | POA: Diagnosis not present

## 2022-08-21 DIAGNOSIS — E7800 Pure hypercholesterolemia, unspecified: Secondary | ICD-10-CM | POA: Diagnosis not present

## 2022-08-21 DIAGNOSIS — E1122 Type 2 diabetes mellitus with diabetic chronic kidney disease: Secondary | ICD-10-CM | POA: Diagnosis not present

## 2022-08-21 DIAGNOSIS — D84821 Immunodeficiency due to drugs: Secondary | ICD-10-CM | POA: Diagnosis not present

## 2022-08-23 LAB — CYTOLOGY - PAP
Comment: NEGATIVE
Diagnosis: NEGATIVE
High risk HPV: NEGATIVE

## 2022-09-03 DIAGNOSIS — E88819 Insulin resistance, unspecified: Secondary | ICD-10-CM | POA: Diagnosis not present

## 2022-09-03 DIAGNOSIS — Z1231 Encounter for screening mammogram for malignant neoplasm of breast: Secondary | ICD-10-CM | POA: Diagnosis not present

## 2022-09-03 DIAGNOSIS — N1831 Chronic kidney disease, stage 3a: Secondary | ICD-10-CM | POA: Diagnosis not present

## 2022-09-03 DIAGNOSIS — R7989 Other specified abnormal findings of blood chemistry: Secondary | ICD-10-CM | POA: Diagnosis not present

## 2022-09-03 DIAGNOSIS — I10 Essential (primary) hypertension: Secondary | ICD-10-CM | POA: Diagnosis not present

## 2022-09-03 DIAGNOSIS — E559 Vitamin D deficiency, unspecified: Secondary | ICD-10-CM | POA: Diagnosis not present

## 2022-09-03 DIAGNOSIS — E7800 Pure hypercholesterolemia, unspecified: Secondary | ICD-10-CM | POA: Diagnosis not present

## 2022-09-03 DIAGNOSIS — E1169 Type 2 diabetes mellitus with other specified complication: Secondary | ICD-10-CM | POA: Diagnosis not present

## 2022-09-26 DIAGNOSIS — R7989 Other specified abnormal findings of blood chemistry: Secondary | ICD-10-CM | POA: Diagnosis not present

## 2022-10-04 DIAGNOSIS — E7800 Pure hypercholesterolemia, unspecified: Secondary | ICD-10-CM | POA: Diagnosis not present

## 2022-10-04 DIAGNOSIS — N1831 Chronic kidney disease, stage 3a: Secondary | ICD-10-CM | POA: Diagnosis not present

## 2022-10-04 DIAGNOSIS — I10 Essential (primary) hypertension: Secondary | ICD-10-CM | POA: Diagnosis not present

## 2022-10-04 DIAGNOSIS — E559 Vitamin D deficiency, unspecified: Secondary | ICD-10-CM | POA: Diagnosis not present

## 2022-10-04 DIAGNOSIS — E1169 Type 2 diabetes mellitus with other specified complication: Secondary | ICD-10-CM | POA: Diagnosis not present

## 2022-10-04 DIAGNOSIS — R7989 Other specified abnormal findings of blood chemistry: Secondary | ICD-10-CM | POA: Diagnosis not present

## 2022-11-07 DIAGNOSIS — R7401 Elevation of levels of liver transaminase levels: Secondary | ICD-10-CM | POA: Diagnosis not present

## 2022-11-07 DIAGNOSIS — E0590 Thyrotoxicosis, unspecified without thyrotoxic crisis or storm: Secondary | ICD-10-CM | POA: Diagnosis not present

## 2022-11-07 DIAGNOSIS — K760 Fatty (change of) liver, not elsewhere classified: Secondary | ICD-10-CM | POA: Diagnosis not present

## 2022-11-22 ENCOUNTER — Other Ambulatory Visit: Payer: Self-pay | Admitting: Family Medicine

## 2022-11-22 DIAGNOSIS — Z1231 Encounter for screening mammogram for malignant neoplasm of breast: Secondary | ICD-10-CM

## 2022-11-26 DIAGNOSIS — N1831 Chronic kidney disease, stage 3a: Secondary | ICD-10-CM | POA: Diagnosis not present

## 2022-11-26 DIAGNOSIS — E559 Vitamin D deficiency, unspecified: Secondary | ICD-10-CM | POA: Diagnosis not present

## 2022-11-26 DIAGNOSIS — E1169 Type 2 diabetes mellitus with other specified complication: Secondary | ICD-10-CM | POA: Diagnosis not present

## 2022-11-26 DIAGNOSIS — E7800 Pure hypercholesterolemia, unspecified: Secondary | ICD-10-CM | POA: Diagnosis not present

## 2022-11-26 DIAGNOSIS — R7989 Other specified abnormal findings of blood chemistry: Secondary | ICD-10-CM | POA: Diagnosis not present

## 2022-11-26 DIAGNOSIS — I10 Essential (primary) hypertension: Secondary | ICD-10-CM | POA: Diagnosis not present

## 2022-12-05 DIAGNOSIS — R768 Other specified abnormal immunological findings in serum: Secondary | ICD-10-CM | POA: Diagnosis not present

## 2022-12-06 ENCOUNTER — Other Ambulatory Visit: Payer: Self-pay

## 2022-12-06 ENCOUNTER — Emergency Department (HOSPITAL_BASED_OUTPATIENT_CLINIC_OR_DEPARTMENT_OTHER)
Admission: EM | Admit: 2022-12-06 | Discharge: 2022-12-06 | Disposition: A | Discharge status: Home / Self Care | Payer: 59 | Attending: Emergency Medicine | Admitting: Emergency Medicine

## 2022-12-06 ENCOUNTER — Encounter (HOSPITAL_BASED_OUTPATIENT_CLINIC_OR_DEPARTMENT_OTHER): Payer: Self-pay | Admitting: Emergency Medicine

## 2022-12-06 ENCOUNTER — Emergency Department (HOSPITAL_BASED_OUTPATIENT_CLINIC_OR_DEPARTMENT_OTHER): Payer: 59 | Admitting: Radiology

## 2022-12-06 DIAGNOSIS — I10 Essential (primary) hypertension: Secondary | ICD-10-CM | POA: Diagnosis not present

## 2022-12-06 DIAGNOSIS — Z79899 Other long term (current) drug therapy: Secondary | ICD-10-CM | POA: Diagnosis not present

## 2022-12-06 DIAGNOSIS — R079 Chest pain, unspecified: Secondary | ICD-10-CM | POA: Diagnosis not present

## 2022-12-06 DIAGNOSIS — R0789 Other chest pain: Secondary | ICD-10-CM | POA: Diagnosis not present

## 2022-12-06 DIAGNOSIS — E039 Hypothyroidism, unspecified: Secondary | ICD-10-CM | POA: Insufficient documentation

## 2022-12-06 DIAGNOSIS — Z91040 Latex allergy status: Secondary | ICD-10-CM | POA: Diagnosis not present

## 2022-12-06 DIAGNOSIS — E119 Type 2 diabetes mellitus without complications: Secondary | ICD-10-CM | POA: Insufficient documentation

## 2022-12-06 HISTORY — DX: Fatty (change of) liver, not elsewhere classified: K76.0

## 2022-12-06 LAB — CBC WITH DIFFERENTIAL/PLATELET
Abs Immature Granulocytes: 0.03 10*3/uL (ref 0.00–0.07)
Basophils Absolute: 0.1 10*3/uL (ref 0.0–0.1)
Basophils Relative: 1 %
Eosinophils Absolute: 0.1 10*3/uL (ref 0.0–0.5)
Eosinophils Relative: 2 %
HCT: 47.7 % — ABNORMAL HIGH (ref 36.0–46.0)
Hemoglobin: 15.3 g/dL — ABNORMAL HIGH (ref 12.0–15.0)
Immature Granulocytes: 1 %
Lymphocytes Relative: 35 %
Lymphs Abs: 2 10*3/uL (ref 0.7–4.0)
MCH: 30.1 pg (ref 26.0–34.0)
MCHC: 32.1 g/dL (ref 30.0–36.0)
MCV: 93.7 fL (ref 80.0–100.0)
Monocytes Absolute: 0.5 10*3/uL (ref 0.1–1.0)
Monocytes Relative: 8 %
Neutro Abs: 3.1 10*3/uL (ref 1.7–7.7)
Neutrophils Relative %: 53 %
Platelets: 179 10*3/uL (ref 150–400)
RBC: 5.09 MIL/uL (ref 3.87–5.11)
RDW: 14 % (ref 11.5–15.5)
WBC: 5.7 10*3/uL (ref 4.0–10.5)
nRBC: 0 % (ref 0.0–0.2)

## 2022-12-06 LAB — BASIC METABOLIC PANEL
Anion gap: 7 (ref 5–15)
BUN: 17 mg/dL (ref 8–23)
CO2: 27 mmol/L (ref 22–32)
Calcium: 10.3 mg/dL (ref 8.9–10.3)
Chloride: 107 mmol/L (ref 98–111)
Creatinine, Ser: 1.1 mg/dL — ABNORMAL HIGH (ref 0.44–1.00)
GFR, Estimated: 57 mL/min — ABNORMAL LOW (ref >60)
Glucose, Bld: 82 mg/dL (ref 70–99)
Potassium: 4 mmol/L (ref 3.5–5.1)
Sodium: 141 mmol/L (ref 135–145)

## 2022-12-06 LAB — D-DIMER, QUANTITATIVE: D-Dimer, Quant: <0.27 ug{FEU}/mL (ref 0.00–0.50)

## 2022-12-06 LAB — TSH: TSH: 2.282 u[IU]/mL (ref 0.350–4.500)

## 2022-12-06 LAB — T4, FREE: Free T4: 0.8 ng/dL (ref 0.61–1.12)

## 2022-12-06 LAB — TROPONIN I (HIGH SENSITIVITY)
Troponin I (High Sensitivity): 7 ng/L (ref <18)
Troponin I (High Sensitivity): 8 ng/L (ref <18)

## 2022-12-06 MED ORDER — SODIUM CHLORIDE 0.9 % IV BOLUS: 1000.0000 mL | Freq: Once | INTRAVENOUS | Status: DC

## 2022-12-06 NOTE — ED Triage Notes (Signed)
Chest sharp pain above left breast,constant this morning feels like muscle clutching. Pt states she has had intermittent sharp pain to her  neck/ arms/chest but doesn't last. No shortness of breath

## 2022-12-06 NOTE — ED Notes (Signed)
Lab called to redraw a gold top for TSH and T4, free.  RN Bri stated to this RN that Per family member, does not want any additional attempts for lab collection with any addl. IV or straight stick.

## 2022-12-06 NOTE — ED Provider Notes (Signed)
[place] EMERGENCY DEPARTMENT AT [facility] Provider Note   CSN: [id number] Arrival date & time: 12/06/22  4098     History No chief complaint on file.   Erica Meyers is a 62 y.o. female with history of hypothyroidism, hypertension, diabetes mellitus, and hyperlipidemia who presents to the emergency department with left-sided chest pain that has been worsening over the last few days.  Patient states has been having this pain on and off for some time but has been noticing it more frequently over the last few days.  She states that she was doing some laundry which is upstairs and she was going up and down the stairs and noticed that the her chest pain was worse with exertion. It did improve with rest.  She denies any recent long travel, hormone replacement use, leg pain or leg swelling, recent surgery, or history of cancer.  HPI     Home Medications Prior to Admission medications   Medication Sig Start Date End Date Taking? Authorizing Provider  lisinopril (ZESTRIL) 10 MG tablet Take 10 mg by mouth daily. 11/09/22  Yes [provider]  atorvastatin (LIPITOR) 20 MG tablet Take 20 mg by mouth daily.    [provider]  cholecalciferol (VITAMIN D) 1000 UNITS tablet Take 1,000 Units by mouth daily.    [provider]  Desvenlafaxine Succinate ER 25 MG TB24  12/25/19   [provider]  lamoTRIgine (LAMICTAL) 150 MG tablet TAKE 2 TABLETS BY MOUTH TWICE A DAY 12/19/21   Levert Feinstein, MD  levothyroxine (SYNTHROID) 88 MCG tablet TAKE 1 TABLET BY MOUTH EVERY DAY 07/30/22   Shamleffer, Konrad Dolores, MD  lisinopril-hydrochlorothiazide (PRINZIDE,ZESTORETIC) 20-25 MG tablet TAKE 1 TABLET BY MOUTH ONCE A DAY (WILL PAY 3/14) 10/29/14   [provider]  meloxicam (MOBIC) 7.5 MG tablet Take 7.5 mg by mouth daily.    [provider]  Multiple Vitamin (MULTIVITAMIN WITH MINERALS) TABS tablet Take 1 tablet by mouth daily.    [provider]  OZEMPIC, 0.25 OR 0.5 MG/DOSE, 2 MG/3ML SOPN Inject 0.25 mg into the skin once a week. 09/25/21   [provider]  rizatriptan (MAXALT-MLT) 10 MG disintegrating tablet Take 1 tablet (10 mg total) by mouth as needed for migraine. May repeat in 2 hours if needed 11/15/20   Levert Feinstein, MD  topiramate (TOPAMAX) 100 MG tablet TAKE 1 TABLET BY MOUTH TWICE A DAY 12/19/21   Levert Feinstein, MD      Allergies    Penicillins and Latex    Review of Systems   Review of Systems  All other systems reviewed and are negative.   Physical Exam Updated Vital Signs BP (!) 148/68   Pulse (!) 59   Temp 97.9 F (36.6 C) (Oral)   Resp 16   Ht 5\' 3"  (1.6 m)   Wt 74.4 kg   LMP 01/05/2016   SpO2 98%   BMI 29.05 kg/m  Physical Exam Vitals and nursing note reviewed.  Constitutional:      General: She is not in acute distress.    Appearance: Normal appearance.  HENT:     Head: Normocephalic and atraumatic.  Eyes:     General:        Right eye: No discharge.        Left eye: No discharge.  Cardiovascular:     Comments: Regular rate and rhythm.  S1/S2 are distinct without any evidence of murmur, rubs, or gallops.  Radial pulses are 2+  bilaterally.  Dorsalis pedis pulses are 2+ bilaterally.  No evidence of pedal edema. Pulmonary:     Comments: Clear to auscultation bilaterally.  Normal effort.  No respiratory distress.  No evidence of wheezes, rales, or rhonchi heard throughout. Abdominal:     General: Abdomen is flat. Bowel sounds are normal. There is no distension.     Tenderness: There is no abdominal tenderness. There is no guarding or rebound.  Musculoskeletal:        General: Normal range of motion.     Cervical back: Neck supple.  Skin:    General: Skin is warm and dry.     Findings: No rash.  Neurological:     General: No focal deficit present.     Mental Status: She is alert.  Psychiatric:        Mood and Affect: Mood normal.        Behavior: Behavior normal.      ED Results / Procedures / Treatments   Labs (all labs ordered are listed, but only abnormal results are displayed) Labs Reviewed  CBC WITH DIFFERENTIAL/PLATELET - Abnormal; Notable for the following components:      Result Value   Hemoglobin 15.3 (*)    HCT 47.7 (*)    All other components within normal limits  BASIC METABOLIC PANEL - Abnormal; Notable for the following components:   Creatinine, Ser 1.10 (*)    GFR, Estimated 57 (*)    All other components within normal limits  D-DIMER, QUANTITATIVE (NOT AT [facility])  TSH  T4, FREE  TROPONIN I (HIGH SENSITIVITY)  TROPONIN I (HIGH SENSITIVITY)    EKG EKG Interpretation Date/Time:  Thursday December 06 2022 09:31:42 EST Ventricular Rate:  57 PR Interval:  180 QRS Duration:  84 QT Interval:  404 QTC Calculation: 394 R Axis:   -12  Text Interpretation: Sinus rhythm Probable anteroseptal infarct No significant change since last tracing Confirmed by Fulton Reek [phone]) on 12/06/2022 9:37:09 AM  Radiology DG Chest 2 View  Result Date: 12/06/2022 CLINICAL DATA:  Left-sided chest pain for a few days EXAM: CHEST - 2 VIEW COMPARISON:  X-ray 04/27/2018 FINDINGS: The heart size and mediastinal contours are within normal limits. No consolidation, pneumothorax or effusion. No edema. The visualized skeletal structures are unremarkable. Overlapping cardiac leads. IMPRESSION: No acute cardiopulmonary disease. Electronically Signed   By: Karen Kays M.D.   On: 12/06/2022 13:49    Procedures Procedures    Medications Ordered in ED Medications - No data to display  ED Course/ Medical Decision Making/ A&P Clinical Course as of 12/06/22 1824  Thu Dec 06, 2022  1151 Basic metabolic panel(!) Creatinine is at patient's baseline. No significant abnormalities.  [CF]  1154 CBC with Differential(!) Seems to be at the patient's baseline in comparison to previous results.  [CF]  1240 Troponin I (High Sensitivity) Initial troponin is  normal. [CF]  1240 D-dimer, quantitative Negative. I have a low suspicion for PE. [CF]  1620 Please inform by nursing staff that your TSH, free T4, and delta troponin were obtained but hemolyzed. The IV that the patient currently has is no longer working and she does not want to get stuck again.  The patient's heart score is 4 and she does fall in the moderate risk category which would inferior necessitate a delta Trop. [CF]  1642 Shared decision making was done with the patient at the bedside whether or not to try to redraw for delta troponin, TSH, and free T4.  I  stressed the importance of these tests and that if we did not get them she would be leaving AGAINST MEDICAL ADVICE.  Mainly because I am concerned given her risk factors and overall heart score given the clinical story.  Patient agrees we will try to redraw these labs. [CF]  1823 Troponin I (High Sensitivity) Delta troponin is normal.  [CF]  1823 TSH Normal.  [CF]    Clinical Course User Index [CF] Teressa Lower, PA-C   {   Click here for ABCD2, HEART and other calculators  Medical Decision Making ANGELEEN HORNEY is a 62 y.o. female patient who presents to the emergency department today for further evaluation of chest pain.  Differential diagnosis does include ACS, muscular involvement, costochondritis, pulmonary embolism, complication of thyroid disease.  Will plan to get some cardiac enzymes, look at her thyroid level, and get a D-dimer to further assess.  Initial EKG was reviewed by myself and my attending we do not see any obvious signs of ACS at this time.  Patient is resting comfortably in the emergency room.  Intake vitals does show that she is bradycardic and tachypneic.  During the interview, patient did not have any evidence of bradycardia and/or tachypnea.  No clinical signs of ACS today.  Low suspicion for pulmonary embolism.  Likely nonspecific chest pain.  Will have her follow-up with her primary care doctor.  Strict  return precautions were discussed.  She is safe for discharge.  Amount and/or Complexity of Data Reviewed Labs: ordered. Decision-making details documented in ED Course. Radiology: ordered.    Final Clinical Impression(s) / ED Diagnoses Final diagnoses:  Chest pain, unspecified type    Rx / DC Orders ED Discharge Orders     None         Teressa Lower, [place] 12/06/22 1825    Laurence Spates, MD 12/06/22 2018

## 2022-12-06 NOTE — ED Triage Notes (Signed)
Pt has high blood pressure, had been exercising and her blood pressure meds were adjusted for low pressure

## 2022-12-06 NOTE — Discharge Instructions (Signed)
As we discussed, all of your labs were reassuring today.  No clinical signs of heart attack, low thyroid, infection.  I would like for you to follow-up with your primary care doctor for further evaluation.  You may return to the emergency department for any worsening symptoms.

## 2022-12-06 NOTE — ED Notes (Signed)
Rt. Upper arm IV not drawing back any blood, thyroid tests and trop need to be redrawn, Iv appears to be infiltrated.  Pt requests to see EDP made aware

## 2022-12-07 DIAGNOSIS — R079 Chest pain, unspecified: Secondary | ICD-10-CM | POA: Diagnosis not present

## 2022-12-20 ENCOUNTER — Ambulatory Visit
Admission: RE | Admit: 2022-12-20 | Discharge: 2022-12-20 | Disposition: A | Discharge status: Home / Self Care | Payer: 59 | Source: Ambulatory Visit | Attending: Family Medicine | Admitting: Family Medicine

## 2022-12-20 DIAGNOSIS — Z1231 Encounter for screening mammogram for malignant neoplasm of breast: Secondary | ICD-10-CM

## 2022-12-24 ENCOUNTER — Other Ambulatory Visit: Payer: Self-pay | Admitting: Family Medicine

## 2022-12-24 DIAGNOSIS — R928 Other abnormal and inconclusive findings on diagnostic imaging of breast: Secondary | ICD-10-CM

## 2022-12-25 DIAGNOSIS — K760 Fatty (change of) liver, not elsewhere classified: Secondary | ICD-10-CM | POA: Diagnosis not present

## 2022-12-26 DIAGNOSIS — E7800 Pure hypercholesterolemia, unspecified: Secondary | ICD-10-CM | POA: Diagnosis not present

## 2022-12-26 DIAGNOSIS — E559 Vitamin D deficiency, unspecified: Secondary | ICD-10-CM | POA: Diagnosis not present

## 2022-12-26 DIAGNOSIS — I10 Essential (primary) hypertension: Secondary | ICD-10-CM | POA: Diagnosis not present

## 2022-12-26 DIAGNOSIS — R7989 Other specified abnormal findings of blood chemistry: Secondary | ICD-10-CM | POA: Diagnosis not present

## 2022-12-26 DIAGNOSIS — N1831 Chronic kidney disease, stage 3a: Secondary | ICD-10-CM | POA: Diagnosis not present

## 2022-12-26 DIAGNOSIS — E1169 Type 2 diabetes mellitus with other specified complication: Secondary | ICD-10-CM | POA: Diagnosis not present

## 2023-01-02 ENCOUNTER — Other Ambulatory Visit: Payer: Self-pay | Admitting: Internal Medicine

## 2023-01-02 DIAGNOSIS — D125 Benign neoplasm of sigmoid colon: Secondary | ICD-10-CM | POA: Diagnosis not present

## 2023-01-02 DIAGNOSIS — D120 Benign neoplasm of cecum: Secondary | ICD-10-CM | POA: Diagnosis not present

## 2023-01-02 DIAGNOSIS — Z09 Encounter for follow-up examination after completed treatment for conditions other than malignant neoplasm: Secondary | ICD-10-CM | POA: Diagnosis not present

## 2023-01-02 DIAGNOSIS — K5730 Diverticulosis of large intestine without perforation or abscess without bleeding: Secondary | ICD-10-CM | POA: Diagnosis not present

## 2023-01-02 DIAGNOSIS — Z860100 Personal history of colon polyps, unspecified: Secondary | ICD-10-CM | POA: Diagnosis not present

## 2023-01-02 DIAGNOSIS — K648 Other hemorrhoids: Secondary | ICD-10-CM | POA: Diagnosis not present

## 2023-01-04 DIAGNOSIS — D120 Benign neoplasm of cecum: Secondary | ICD-10-CM | POA: Diagnosis not present

## 2023-01-04 DIAGNOSIS — D125 Benign neoplasm of sigmoid colon: Secondary | ICD-10-CM | POA: Diagnosis not present

## 2023-01-12 ENCOUNTER — Other Ambulatory Visit: Payer: Self-pay | Admitting: Internal Medicine

## 2023-03-08 ENCOUNTER — Other Ambulatory Visit: Payer: Self-pay | Admitting: Internal Medicine

## 2023-03-13 ENCOUNTER — Ambulatory Visit: Payer: 59 | Admitting: "Endocrinology

## 2023-03-15 ENCOUNTER — Other Ambulatory Visit: Payer: 59

## 2023-03-15 ENCOUNTER — Ambulatory Visit
Admission: RE | Admit: 2023-03-15 | Discharge: 2023-03-15 | Disposition: A | Discharge status: Home / Self Care | Payer: 59 | Source: Ambulatory Visit | Attending: Family Medicine | Admitting: Family Medicine

## 2023-03-15 DIAGNOSIS — N6315 Unspecified lump in the right breast, overlapping quadrants: Secondary | ICD-10-CM | POA: Diagnosis not present

## 2023-03-15 DIAGNOSIS — R928 Other abnormal and inconclusive findings on diagnostic imaging of breast: Secondary | ICD-10-CM

## 2023-03-15 DIAGNOSIS — N6321 Unspecified lump in the left breast, upper outer quadrant: Secondary | ICD-10-CM | POA: Diagnosis not present

## 2023-03-15 DIAGNOSIS — N6012 Diffuse cystic mastopathy of left breast: Secondary | ICD-10-CM | POA: Diagnosis not present

## 2023-03-15 DIAGNOSIS — N6011 Diffuse cystic mastopathy of right breast: Secondary | ICD-10-CM | POA: Diagnosis not present

## 2023-03-15 DIAGNOSIS — N6311 Unspecified lump in the right breast, upper outer quadrant: Secondary | ICD-10-CM | POA: Diagnosis not present

## 2023-03-15 DIAGNOSIS — N6325 Unspecified lump in the left breast, overlapping quadrants: Secondary | ICD-10-CM | POA: Diagnosis not present

## 2023-04-02 DIAGNOSIS — N1831 Chronic kidney disease, stage 3a: Secondary | ICD-10-CM | POA: Diagnosis not present

## 2023-04-02 DIAGNOSIS — E1122 Type 2 diabetes mellitus with diabetic chronic kidney disease: Secondary | ICD-10-CM | POA: Diagnosis not present

## 2023-04-02 DIAGNOSIS — E041 Nontoxic single thyroid nodule: Secondary | ICD-10-CM | POA: Diagnosis not present

## 2023-04-02 DIAGNOSIS — Z79899 Other long term (current) drug therapy: Secondary | ICD-10-CM | POA: Diagnosis not present

## 2023-04-02 DIAGNOSIS — E7800 Pure hypercholesterolemia, unspecified: Secondary | ICD-10-CM | POA: Diagnosis not present

## 2023-04-02 DIAGNOSIS — E039 Hypothyroidism, unspecified: Secondary | ICD-10-CM | POA: Diagnosis not present

## 2023-04-02 DIAGNOSIS — I10 Essential (primary) hypertension: Secondary | ICD-10-CM | POA: Diagnosis not present

## 2023-04-02 DIAGNOSIS — Z0001 Encounter for general adult medical examination with abnormal findings: Secondary | ICD-10-CM | POA: Diagnosis not present

## 2023-04-02 NOTE — Telephone Encounter (Signed)
 I called patient back to reschedule appt with Dr Roosevelt Locks. She asked if the levothyroxine could please be sent into [id number] - [place], [place] - [address]

## 2023-04-05 ENCOUNTER — Other Ambulatory Visit: Payer: Self-pay | Admitting: Family Medicine

## 2023-04-05 DIAGNOSIS — E041 Nontoxic single thyroid nodule: Secondary | ICD-10-CM

## 2023-04-12 ENCOUNTER — Ambulatory Visit
Admission: RE | Admit: 2023-04-12 | Discharge: 2023-04-12 | Disposition: A | Discharge status: Home / Self Care | Source: Ambulatory Visit | Attending: Family Medicine | Admitting: Family Medicine

## 2023-04-12 DIAGNOSIS — E041 Nontoxic single thyroid nodule: Secondary | ICD-10-CM | POA: Diagnosis not present

## 2023-04-17 ENCOUNTER — Ambulatory Visit (INDEPENDENT_AMBULATORY_CARE_PROVIDER_SITE_OTHER): Admitting: "Endocrinology

## 2023-04-17 ENCOUNTER — Encounter: Payer: Self-pay | Admitting: "Endocrinology

## 2023-04-17 ENCOUNTER — Other Ambulatory Visit: Payer: Self-pay | Admitting: "Endocrinology

## 2023-04-17 VITALS — BP 110/80 | HR 88 | Ht 63.0 in | Wt 166.0 lb

## 2023-04-17 DIAGNOSIS — E890 Postprocedural hypothyroidism: Secondary | ICD-10-CM

## 2023-04-17 DIAGNOSIS — E042 Nontoxic multinodular goiter: Secondary | ICD-10-CM | POA: Diagnosis not present

## 2023-04-17 LAB — TSH(REFL): TSH: 4.13 m[IU]/L (ref 0.40–4.50)

## 2023-04-17 LAB — REFLEX TIQ

## 2023-04-17 NOTE — Progress Notes (Signed)
 Outpatient Endocrinology Note Erica Leonidas, MD  04/17/23   Erica Meyers 1960/06/04 [id number]  Referring Provider: Darrow Bussing, MD Primary Care Provider: Darrow Bussing, MD Subjective  No chief complaint on file.   Assessment & Plan  Diagnoses and all orders for this visit:  Postablative hypothyroidism -     TSH(Reflex) -     [place] FNA BX THYROID 1ST LESION AFIRMA; Future -     [place] FNA BX THYROID 1ST LESION AFIRMA  Multiple thyroid nodules -     [place] FNA BX THYROID 1ST LESION AFIRMA; Future -     [place] FNA BX THYROID 1ST LESION Erica Meyers    Erica Meyers is currently taking levothyroxine 88 mcg po qd. Patient was previously biochemically euthyroid.  Educated on thyroid axis.  Recommend the following: Take levothyroxine every morning. Ordered labs  Advised to take levothyroxine first thing in the morning on empty stomach and wait at least 30 minutes to 1 hour before eating or drinking anything or taking any other medications. Space out levothyroxine by 4 hours from any acid reflux medication/fibrate/iron/calcium/multivitamin. Advised to take birth control pills and nutritional supplements in the evening. Repeat lab before next visit or sooner if symptoms of hyperthyroidism or hypothyroidism develop.  Notify us immediately in case of pregnancy/breastfeeding or significant weight gain or loss. Counseled on compliance and follow up needs.   Has multiple thyroid nodules Last thyroid U/S in 03/2023 reported TR4 Nodule # 4: Slowly enlarging hypoechoic solid nodule in the left mid gland now measures 2.1 x 1.5 x 0.9 cm. Ordered FNA. Another nodule (TR4 Nodule # 5: Hypoechoic solid nodule in the left lower gland has slightly enlarged and now measures 1.0 x 0.9 x 0.6 cm, needs 1 year follow up. Nodule # 3 TR2: 3.1 x 2.4 x 2.3 cm and other nodules labelled <1cm do meet meet criteria for FNA.  Ordered FNA, patient reports will need special sedation due to prior history of  issues  I have reviewed current medications, nurse's notes, allergies, vital signs, past medical and surgical history, family medical history, and social history for this encounter. Counseled patient on symptoms, examination findings, lab findings, imaging results, treatment decisions and monitoring and prognosis. The patient understood the recommendations and agrees with the treatment plan. All questions regarding treatment plan were fully answered.   Return in about 6 months (around 10/18/2023) for visit + labs before next visit, labs today.   Erica Manhattan, MD  04/17/23   I have reviewed current medications, nurse's notes, allergies, vital signs, past medical and surgical history, family medical history, and social history for this encounter. Counseled patient on symptoms, examination findings, lab findings, imaging results, treatment decisions and monitoring and prognosis. The patient understood the recommendations and agrees with the treatment plan. All questions regarding treatment plan were fully answered.   History of Present Illness Erica Meyers is a 63 y.o. year old female who presents to our clinic with post-ablative hypothyroidism.  Seen by Dr Everardo All in past  Per records, she has "post-RAI hypothyroidism (in 2002, she was dx'ed with hyperthyroidism, due to MNG; she had RAI in June, 2018; she started synthroid in June, 2019; bx showed SCANT FOLLICULAR EPITHELIUM PRESENT (BETHESDA CATEGORY I); f/u US in 2019 showed Slight interval growth of nodule within left lobe , which now meets imaging criteria to recommend bx.  Despite reduction in size, nodule #1 within the right lobe of the thyroid continues to meet imaging criteria to recommend  bx--which in 2019 was cat 1; [place] in 2022 advised 1 year f/u)."   On levothyroxine 88 mcg po every day  Reports feeling tired a lot and currently under seasonal allergy   Compressive symptoms:  dysphagia  No dysphonia  Yes  positional dyspnea  (especially with simultaneous arms elevation)  No  Smokes  No On biotin  No Personal history of head/neck surgery/irradiation  Yes, RAI ablation and tonsillectomy   Grave's Ophthalmopathy Clinical Activity Score: 0/9  Physical Exam  BP 110/80   Pulse 88   Ht 5\' 3"  (1.6 m)   Wt 166 lb (75.3 kg)   LMP 01/05/2016   SpO2 98%   BMI 29.41 kg/m  Constitutional: well developed, well nourished Head: normocephalic, atraumatic, no exophthalmos Eyes: sclera anicteric, no redness Neck: no thyromegaly, no thyroid tenderness; no nodules palpated Lungs: normal respiratory effort Neurology: alert and oriented, no fine hand tremor Skin: dry, no appreciable rashes Musculoskeletal: no appreciable defects Psychiatric: normal mood and affect  Allergies Allergies  Allergen Reactions   Penicillins     Childhood, does not recall reaction.   Latex Rash    Current Medications Patient's Medications  New Prescriptions   No medications on file  Previous Medications   ATORVASTATIN (LIPITOR) 20 MG TABLET    Take 20 mg by mouth daily.   CHOLECALCIFEROL (VITAMIN D) 1000 UNITS TABLET    Take 1,000 Units by mouth daily.   DESVENLAFAXINE SUCCINATE ER 25 MG TB24       LAMOTRIGINE (LAMICTAL) 150 MG TABLET    TAKE 2 TABLETS BY MOUTH TWICE A DAY   LEVOTHYROXINE (SYNTHROID) 88 MCG TABLET    TAKE 1 TABLET BY MOUTH EVERY DAY   LISINOPRIL (ZESTRIL) 10 MG TABLET    Take 10 mg by mouth daily.   LISINOPRIL-HYDROCHLOROTHIAZIDE (PRINZIDE,ZESTORETIC) 20-25 MG TABLET    TAKE 1 TABLET BY MOUTH ONCE A DAY (WILL PAY 3/14)   MELOXICAM (MOBIC) 7.5 MG TABLET    Take 7.5 mg by mouth daily.   MULTIPLE VITAMIN (MULTIVITAMIN WITH MINERALS) TABS TABLET    Take 1 tablet by mouth daily.   OZEMPIC, 0.25 OR 0.5 MG/DOSE, 2 MG/3ML SOPN    Inject 0.25 mg into the skin once a week.   RIZATRIPTAN (MAXALT-MLT) 10 MG DISINTEGRATING TABLET    Take 1 tablet (10 mg total) by mouth as needed for migraine. May repeat in 2 hours if needed    TOPIRAMATE (TOPAMAX) 100 MG TABLET    TAKE 1 TABLET BY MOUTH TWICE A DAY  Modified Medications   No medications on file  Discontinued Medications   No medications on file    Past Medical History Past Medical History:  Diagnosis Date   Anemia    Depression    Diabetes mellitus without complication (HCC)    Dyslipidemia    Epilepsy (HCC)    Headache    Hypertension    Non-alcoholic fatty liver disease    Thyroid disease     Past Surgical History Past Surgical History:  Procedure Laterality Date   CESAREAN SECTION     TONSILLECTOMY     TUBAL LIGATION     VEIN LIGATION AND STRIPPING Left     Family History family history includes Hypertension in her mother; Stroke in her mother; Thyroid disease in her mother.  Social History Social History   Socioeconomic History   Marital status: Divorced    Spouse name: Not on file   Number of children: Not on file   Years  of education: Not on file   Highest education level: Not on file  Occupational History   Not on file  Tobacco Use   Smoking status: Never   Smokeless tobacco: Never  Vaping Use   Vaping status: Never Used  Substance and Sexual Activity   Alcohol use: No   Drug use: No   Sexual activity: Not on file  Other Topics Concern   Not on file  Social History Narrative   Not on file   Social Drivers of Health   Financial Resource Strain: Not on file  Food Insecurity: Not on file  Transportation Needs: Not on file  Physical Activity: Not on file  Stress: Not on file  Social Connections: Not on file  Intimate Partner Violence: Not on file    Laboratory Investigations Lab Results  Component Value Date   TSH 2.282 12/06/2022   TSH 5.64 (H) 05/04/2021   TSH 2.95 10/16/2019   FREET4 0.80 12/06/2022   FREET4 0.93 05/04/2021   FREET4 0.77 10/16/2019     No results found for: "TSI"   No components found for: "TRAB"   No results found for: "CHOL" No results found for: "HDL" No results found for:  "LDLCALC" No results found for: "TRIG" No results found for: "CHOLHDL" Lab Results  Component Value Date   CREATININE 1.10 (H) 12/06/2022   No results found for: "GFR"    Component Value Date/Time   NA 141 12/06/2022 1025   NA 141 01/06/2020 1332   K 4.0 12/06/2022 1025   CL 107 12/06/2022 1025   CO2 27 12/06/2022 1025   GLUCOSE 82 12/06/2022 1025   BUN 17 12/06/2022 1025   BUN 11 01/06/2020 1332   CREATININE 1.10 (H) 12/06/2022 1025   CALCIUM 10.3 12/06/2022 1025   PROT 7.3 01/06/2020 1332   ALBUMIN 4.5 01/06/2020 1332   AST 20 01/06/2020 1332   ALT 24 01/06/2020 1332   ALKPHOS 88 01/06/2020 1332   BILITOT 0.3 01/06/2020 1332   GFRNONAA 57 (L) 12/06/2022 1025   GFRAA 64 01/06/2020 1332      Latest Ref Rng & Units 12/06/2022   10:25 AM 01/06/2020    1:32 PM 10/14/2018    2:18 PM  BMP  Glucose 70 - 99 mg/dL 82  161  096   BUN 8 - 23 mg/dL 17  11  21    Creatinine 0.44 - 1.00 mg/dL 0.45  4.09  8.11   BUN/Creat Ratio 9 - 23  10    Sodium 135 - 145 mmol/L 141  141  134   Potassium 3.5 - 5.1 mmol/L 4.0  4.7  3.5   Chloride 98 - 111 mmol/L 107  104  98   CO2 22 - 32 mmol/L 27  23  25    Calcium 8.9 - 10.3 mg/dL 91.4  9.6  9.8        Component Value Date/Time   WBC 5.7 12/06/2022 1025   RBC 5.09 12/06/2022 1025   HGB 15.3 (H) 12/06/2022 1025   HGB 15.2 01/06/2020 1332   HCT 47.7 (H) 12/06/2022 1025   HCT 45.2 01/06/2020 1332   PLT 179 12/06/2022 1025   PLT 248 01/06/2020 1332   MCV 93.7 12/06/2022 1025   MCV 90 01/06/2020 1332   MCH 30.1 12/06/2022 1025   MCHC 32.1 12/06/2022 1025   RDW 14.0 12/06/2022 1025   RDW 12.5 01/06/2020 1332   LYMPHSABS 2.0 12/06/2022 1025   LYMPHSABS 1.7 01/06/2020 1332   MONOABS 0.5  12/06/2022 1025   EOSABS 0.1 12/06/2022 1025   EOSABS 0.1 01/06/2020 1332   BASOSABS 0.1 12/06/2022 1025   BASOSABS 0.1 01/06/2020 1332      Parts of this note may have been dictated using voice recognition software. There may be variances in spelling  and vocabulary which are unintentional. Not all errors are proofread. Please notify the Thereasa Parkin if any discrepancies are noted or if the meaning of any statement is not clear.

## 2023-05-08 ENCOUNTER — Telehealth: Payer: Self-pay

## 2023-05-08 NOTE — Telephone Encounter (Signed)
 Pt called office regarding biopsy, Pt recently had an appt for biopsy of her thyroid. During Procedure Pt HR drop and wasn't able to finish the biopsy. Pt was advise that she need to either get admitted into the hospital for the biopsy or to be referred to an ENT. Please advise?

## 2023-05-10 ENCOUNTER — Other Ambulatory Visit: Payer: Self-pay | Admitting: "Endocrinology

## 2023-05-10 DIAGNOSIS — E042 Nontoxic multinodular goiter: Secondary | ICD-10-CM

## 2023-05-10 NOTE — Telephone Encounter (Signed)
 Spoke to pt regarding Dr Roosevelt Locks reordering biopsy to be done at [facility] due to history HR dropping.

## 2023-05-20 NOTE — Progress Notes (Unsigned)
 Myrlene Asper, DO  [facility] Procedure Requests Cc: Xayasine, Melissa OK for US  guided FNA of lesion 4, left thyroid .  Mabel Savage       Previous Messages    ----- Message ----- From: Derrell Flight Sent: 05/17/2023   9:34 AM EDT To: Derrell Flight; Joelle Musca; * Subject: US  FNA BX THYROID  1ST LESION AFIRMA            Procedure :US  FNA BX THYROID  1ST LESION AFIRMA  Reason : HISTORY OF HEART RATE DROP DURING PRIOR FNA SO ASKED TO PROCEED IN HOSPITAL. TR4 Nodule # 4: Slowly enlarging hypoechoic solid nodule in the left mid gland now measures 2.1 x 1.5 x 0.9 cm. Margins are ill-defined. Dx: Multiple thyroid  nodules [E04.2 (ICD-10-CM)]  History :US  THYROID ,US  LIMITED ULTRASOUND INCLUDING AXILLA LEFT BREAST ,US  LIMITED ULTRASOUND INCLUDING AXILLA RIGHT BREAST,MM 3D DIAGNOSTIC MAMMOGRAM BILATERAL BREAST,  Provider: [name], MD  Provider contact : [phone]    patient is requesting if this can be done with moderate sedation - has had a bad experience in the past with procedures like this

## 2023-05-21 NOTE — Progress Notes (Unsigned)
 Myrlene Asper, DO  Derrell Flight Cc: Deangelo, Marine Sia, RT Our service can handle mod sedation for any biopsy.  We need to understand that offering this for standard thyroid  biopsy will be extra strain on resources and expense.  We can provide mod sedation if needed.  Mabel Savage       Previous Messages    ----- Message ----- From: Derrell Flight Sent: 05/20/2023   5:12 PM EDT To: Myrlene Asper, DO Subject: RE: US  FNA BX THYROID  1ST LESION AFIRMA        Dr, Mabel Savage,  patient is requesting if this can be done with moderate sedation - has had a bad experience in the past with procedures like this. ----- Message ----- From: Myrlene Asper, DO Sent: 05/20/2023   3:50 PM EDT To: Derrell Flight; Joelle Musca; * Subject: RE: US  FNA BX THYROID  1ST LESION Rayne Callas        OK for US  guided FNA of lesion 4, left thyroid .  Mabel Savage ----- Message ----- From: Derrell Flight Sent: 05/17/2023   9:34 AM EDT To: Derrell Flight; Joelle Musca; * Subject: US  FNA BX THYROID  1ST LESION AFIRMA            Procedure :US  FNA BX THYROID  1ST LESION AFIRMA  Reason : HISTORY OF HEART RATE DROP DURING PRIOR FNA SO ASKED TO PROCEED IN HOSPITAL. TR4 Nodule # 4: Slowly enlarging hypoechoic solid nodule in the left mid gland now measures 2.1 x 1.5 x 0.9 cm. Margins are ill-defined. Dx: Multiple thyroid  nodules [E04.2 (ICD-10-CM)]  History :US  THYROID ,US  LIMITED ULTRASOUND INCLUDING AXILLA LEFT BREAST ,US  LIMITED ULTRASOUND INCLUDING AXILLA RIGHT BREAST,MM 3D DIAGNOSTIC MAMMOGRAM BILATERAL BREAST,  Provider: [name], MD  Provider contact : [phone]    patient is requesting if this can be done with moderate sedation - has had a bad experience in the past

## 2023-06-11 ENCOUNTER — Telehealth: Payer: Self-pay

## 2023-06-11 NOTE — Telephone Encounter (Signed)
 Pt called office to informed Dr Vertell Gory that she was schedule to have a FNA done on Friday 06/14/23. Pt and her family are concerned about pt having FNA done on Friday due to heart rate dropping to low the last two time she's tried to have FNA done. Pt is request Dr Vertell Gory to call hospital and see if that will add moderate sedation for the FNA on Friday.

## 2023-06-11 NOTE — Telephone Encounter (Signed)
 Lvm for pt to call back.

## 2023-06-12 NOTE — Telephone Encounter (Signed)
 Spoke to pt regarding pt concerns with her have a FNA on Friday. Informed pt of Dr Vertell Gory recommendation.

## 2023-06-14 ENCOUNTER — Other Ambulatory Visit: Payer: Self-pay

## 2023-06-14 ENCOUNTER — Other Ambulatory Visit (HOSPITAL_COMMUNITY): Payer: Self-pay | Admitting: Radiology

## 2023-06-14 ENCOUNTER — Ambulatory Visit (HOSPITAL_COMMUNITY)
Admission: RE | Admit: 2023-06-14 | Discharge: 2023-06-14 | Disposition: A | Discharge status: Home / Self Care | Source: Ambulatory Visit | Attending: "Endocrinology | Admitting: "Endocrinology

## 2023-06-14 ENCOUNTER — Encounter (HOSPITAL_COMMUNITY): Payer: Self-pay

## 2023-06-14 DIAGNOSIS — E119 Type 2 diabetes mellitus without complications: Secondary | ICD-10-CM | POA: Diagnosis not present

## 2023-06-14 DIAGNOSIS — Z7985 Long-term (current) use of injectable non-insulin antidiabetic drugs: Secondary | ICD-10-CM | POA: Insufficient documentation

## 2023-06-14 DIAGNOSIS — R55 Syncope and collapse: Secondary | ICD-10-CM | POA: Insufficient documentation

## 2023-06-14 DIAGNOSIS — I10 Essential (primary) hypertension: Secondary | ICD-10-CM | POA: Insufficient documentation

## 2023-06-14 DIAGNOSIS — E041 Nontoxic single thyroid nodule: Secondary | ICD-10-CM | POA: Diagnosis not present

## 2023-06-14 DIAGNOSIS — E042 Nontoxic multinodular goiter: Secondary | ICD-10-CM | POA: Insufficient documentation

## 2023-06-14 LAB — GLUCOSE, CAPILLARY
Glucose-Capillary: 144 mg/dL — ABNORMAL HIGH (ref 70–99)
Glucose-Capillary: 112 mg/dL — ABNORMAL HIGH (ref 70–99)

## 2023-06-14 MED ORDER — FENTANYL CITRATE (PF) 100 MCG/2ML IJ SOLN
INTRAMUSCULAR | Status: AC
  Filled 2023-06-14: qty 2

## 2023-06-14 MED ORDER — MIDAZOLAM HCL 2 MG/2ML IJ SOLN
INTRAMUSCULAR | Status: AC
  Filled 2023-06-14: qty 2

## 2023-06-14 MED ORDER — SODIUM CHLORIDE 0.9% FLUSH: 3.0000 mL | INTRAVENOUS | Status: DC | PRN

## 2023-06-14 MED ORDER — MIDAZOLAM HCL 2 MG/2ML IJ SOLN
INTRAMUSCULAR | Status: AC | PRN
Start: 2023-06-14 — End: 2023-06-14
  Administered 2023-06-14: .5 mg via INTRAVENOUS

## 2023-06-14 MED ORDER — LIDOCAINE HCL (PF) 1 % IJ SOLN
10.0000 mL | Freq: Once | INTRAMUSCULAR | Status: AC
  Administered 2023-06-14: 10 mL via INTRADERMAL

## 2023-06-14 MED ORDER — SODIUM CHLORIDE 0.9% FLUSH
3.0000 mL | Freq: Two times a day (BID) | INTRAVENOUS | Status: DC
Start: 2023-06-14 — End: 2023-06-15

## 2023-06-14 MED ORDER — LIDOCAINE HCL (PF) 1 % IJ SOLN
INTRAMUSCULAR | Status: AC
  Filled 2023-06-14: qty 30

## 2023-06-14 MED ORDER — FENTANYL CITRATE (PF) 100 MCG/2ML IJ SOLN
INTRAMUSCULAR | Status: AC | PRN
  Administered 2023-06-14: 25 ug via INTRAVENOUS

## 2023-06-14 MED ORDER — SODIUM CHLORIDE 0.9 % IV BOLUS
INTRAVENOUS | Status: AC | PRN
  Administered 2023-06-14: 1000 mL via INTRAVENOUS

## 2023-06-14 MED ORDER — ACETAMINOPHEN 160 MG/5ML PO SOLN
1000.0000 mg | Freq: Once | ORAL | Status: AC
  Administered 2023-06-14: 1000 mg via ORAL
  Filled 2023-06-14: qty 40.6

## 2023-06-14 MED ORDER — ATROPINE SULFATE 1 MG/10ML IJ SOSY
PREFILLED_SYRINGE | INTRAMUSCULAR | Status: AC
  Filled 2023-06-14: qty 10

## 2023-06-14 NOTE — H&P (Signed)
 Chief Complaint: Thyroid  Nodule. Meets TI- RADS criteria. Request is for thyroid  biopsy with moderate sedation.  Referring Physician(s): Motwani,Komal  Supervising Physician: Art Largo  Patient Status: [facility] - Out-pt  History of Present Illness: Erica Meyers is a 63 y.o. female outpatient. History of HTN, DM, known thyroid  nodule. FNA attempted by IR on 8.6.19 was aborted after one pass due to vasal vagal response. Cytology resulted as [facility]hes[facility] one. US  thyroid  dated 3.14.25 shows the nodule enlarging. US  reads:  Nodule # 4: Slowly enlarging hypoechoic solid nodule in the left mid gland now measures 2.1 x 1.5 x 0.9 cm. Margins are ill-defined. No calcifications or other suspicious features. Findings are consistent with TI-RADS category 4. **Given size (>/= 1.5 cm) and appearance, fine needle aspiration of this moderately suspicious nodule should be considered based on TI-RADS criteria.   Patient presents for US  thyroid  biopsy with sedation.   Family at bedside. Currently without any significant complaints. Patient alert and laying in bed,calm. Denies any fevers, headache, chest pain, SOB, cough, abdominal pain, nausea, vomiting or bleeding.   No recent labs. All medications are within acceptable parameters. Allergies include latex. Patient has been NPO since midnight.   Return precautions and treatment recommendations and follow-up discussed with the patient  who is agreeable with the plan.     Past Medical History:  Diagnosis Date   Anemia    Depression    Diabetes mellitus without complication (HCC)    Dyslipidemia    Epilepsy (HCC)    Headache    Hypertension    Non-alcoholic fatty liver disease    Thyroid  disease     Past Surgical History:  Procedure Laterality Date   CESAREAN SECTION     TONSILLECTOMY     TUBAL LIGATION     VEIN LIGATION AND STRIPPING Left     Allergies: Penicillins and Latex  Medications: Prior to Admission medications    Medication Sig Start Date End Date Taking? Authorizing Provider  atorvastatin (LIPITOR) 20 MG tablet Take 20 mg by mouth daily.    [provider]  cholecalciferol (VITAMIN D) 1000 UNITS tablet Take 1,000 Units by mouth daily.    [provider]  Desvenlafaxine Succinate ER 25 MG TB24  12/25/19   [provider]  lamoTRIgine  (LAMICTAL ) 150 MG tablet TAKE 2 TABLETS BY MOUTH TWICE A DAY 12/19/21   Phebe Brasil, MD  levothyroxine  (SYNTHROID ) 88 MCG tablet TAKE 1 TABLET BY MOUTH EVERY DAY 07/30/22   Shamleffer, Ibtehal Jaralla, MD  lisinopril (ZESTRIL) 10 MG tablet Take 10 mg by mouth daily. 11/09/22   [provider]  lisinopril-hydrochlorothiazide (PRINZIDE,ZESTORETIC) 20-25 MG tablet TAKE 1 TABLET BY MOUTH ONCE A DAY (WILL PAY 3/14) 10/29/14   [provider]  meloxicam (MOBIC) 7.5 MG tablet Take 7.5 mg by mouth daily.    [provider]  Multiple Vitamin (MULTIVITAMIN WITH MINERALS) TABS tablet Take 1 tablet by mouth daily.    [provider]  OZEMPIC, 0.25 OR 0.5 MG/DOSE, 2 MG/3ML SOPN Inject 0.25 mg into the skin once a week. 09/25/21   [provider]  rizatriptan  (MAXALT -MLT) 10 MG disintegrating tablet Take 1 tablet (10 mg total) by mouth as needed for migraine. May repeat in 2 hours if needed 11/15/20   Phebe Brasil, MD  topiramate  (TOPAMAX ) 100 MG tablet TAKE 1 TABLET BY MOUTH TWICE A DAY 12/19/21   Phebe Brasil, MD     Family History  Problem Relation Age of Onset   Hypertension  Mother    Stroke Mother    Thyroid  disease Mother     Social History   Socioeconomic History   Marital status: Divorced    Spouse name: Not on file   Number of children: Not on file   Years of education: Not on file   Highest education level: Not on file  Occupational History   Not on file  Tobacco Use   Smoking status: Never   Smokeless tobacco: Never  Vaping Use   Vaping status: Never Used  Substance and Sexual Activity   Alcohol  use: No   Drug use: No   Sexual activity: Not on file  Other Topics Concern   Not on file  Social History Narrative   Not on file   Social Drivers of Health   Financial Resource Strain: Not on file  Food Insecurity: Not on file  Transportation Needs: Not on file  Physical Activity: Not on file  Stress: Not on file  Social Connections: Not on file      Review of Systems: A 12 point ROS discussed and pertinent positives are indicated in the HPI above.  All other systems are negative.  Review of Systems  Constitutional:  Negative for fatigue and fever.  HENT:  Negative for congestion.   Respiratory:  Negative for cough and shortness of breath.   Gastrointestinal:  Negative for abdominal pain, diarrhea, nausea and vomiting.    Vital Signs: BP (!) 155/88   Pulse (!) 101   Temp 98.3 F (36.8 C) (Oral)   Resp 18   Ht 5\' 3"  (1.6 m)   Wt 171 lb (77.6 kg)   LMP 01/05/2016   SpO2 94%   BMI 30.29 kg/m     Physical Exam Vitals and nursing note reviewed.  Constitutional:      Appearance: Normal appearance. She is well-developed.  HENT:     Head: Normocephalic and atraumatic.     Mouth/Throat:     Mouth: Mucous membranes are dry.  Eyes:     Conjunctiva/sclera: Conjunctivae normal.  Cardiovascular:     Rate and Rhythm: Regular rhythm. Tachycardia present.  Pulmonary:     Effort: Pulmonary effort is normal.  Musculoskeletal:        General: Normal range of motion.     Cervical back: Normal range of motion.  Skin:    General: Skin is warm.  Neurological:     General: No focal deficit present.     Mental Status: She is alert and oriented to person, place, and time. Mental status is at baseline.  Psychiatric:        Mood and Affect: Mood normal.        Behavior: Behavior normal.        Thought Content: Thought content normal.        Judgment: Judgment normal.     Imaging: No results found.  Labs:  CBC: Recent Labs    12/06/22 1025  WBC 5.7  HGB 15.3*   HCT 47.7*  PLT 179    COAGS: No results for input(s): "INR", "APTT" in the last 8760 hours.  BMP: Recent Labs    12/06/22 1025  NA 141  K 4.0  CL 107  CO2 27  GLUCOSE 82  BUN 17  CALCIUM 10.3  CREATININE 1.10*  GFRNONAA 57*    LIVER FUNCTION TESTS: No results for input(s): "BILITOT", "AST", "ALT", "ALKPHOS", "PROT", "ALBUMIN" in the last 8760 hours.    Assessment and Plan:  64 y.o. female  outpatient. History of HTN, DM, known thyroid  nodule. FNA attempted by IR on 8.6.19 was aborted after one pass due to vasal vagal response. Cytology resulted as [facility] one. US  thyroid  dated 3.14.25 shows the nodule enlarging. US  reads:  Nodule # 4: Slowly enlarging hypoechoic solid nodule in the left mid gland now measures 2.1 x 1.5 x 0.9 cm. Margins are ill-defined. No calcifications or other suspicious features. Findings are consistent with TI-RADS category 4. **Given size (>/= 1.5 cm) and appearance, fine needle aspiration of this moderately suspicious nodule should be considered based on TI-RADS criteria.   Patient presents for US  thyroid  biopsy with sedation.   PLAN: IR Image Guided Thyroid  Nodule Biopsy with Conscious Sedation.  Risks and benefits of left mid thyroid  nodule biopsy was discussed with the patient and/or patient's family including, but not limited to bleeding, infection, damage to adjacent structures or low yield requiring additional tests.  All of the questions were answered and there is agreement to proceed.  Consent signed and in chart.  Thank you for this interesting consult.  I greatly enjoyed meeting Erica Meyers and look forward to participating in their care.  A copy of this report was sent to the requesting provider on this date.  Electronically Signed: Marceil Sensor, NP 06/14/2023, 12:25 PM   I spent a total of  30 Minutes   in face to face in clinical consultation, greater than 50% of which was counseling/coordinating care for  thyroid  biopsy with moderate sedation.

## 2023-06-14 NOTE — Sedation Documentation (Addendum)
 Pt appeared to have vasovagal response during procedure; HR dropped to 40's, systolic bp 80s; c/o nausea prior to becoming unresponsive; pt currently alert to voice, following some commands; Dr. Darylene Epley at bedside

## 2023-06-14 NOTE — Procedures (Signed)
 Vascular and Interventional Radiology Procedure Note  Patient: Erica Meyers DOB: [birth year] Medical Record Number: [id number] Note Date/Time: 06/14/23 1:05 PM   Performing Physician: Art Largo, MD Assistant(s): None  Diagnosis: 2.1 cm L mid TR-4 nodule. Prev Bx attempt 08/2017   Procedure: LEFT MID THYROID  NODULE BIOPSY  Anesthesia: Conscious Sedation Complications: Vaso vagal reaction. Pt was treated symptomatically with result Estimated Blood Loss: Minimal Specimens: Sent for Cytology  Findings:  Successful Ultrasound-guided biopsy of a 2.1 cm L mid TR-4 nodule.  A total of 3 samples were obtained. Hemostasis of the tract was achieved using Manual Pressure.  Plan: Bed rest for 1 hours.  See detailed procedure note with images in PACS. The patient tolerated the procedure well without incident or complication and was returned to Recovery in stable condition.    Art Largo, MD [facility]   Pager. [phone] Clinic. [phone]

## 2023-06-14 NOTE — Sedation Documentation (Signed)
 Pt alert to voice, following commands, VSS upon handoff to short stay

## 2023-06-17 ENCOUNTER — Telehealth: Payer: Self-pay

## 2023-06-17 LAB — CYTOLOGY - NON PAP

## 2023-06-17 NOTE — Telephone Encounter (Signed)
 Error

## 2023-06-18 ENCOUNTER — Encounter: Payer: Self-pay | Admitting: "Endocrinology

## 2023-06-18 ENCOUNTER — Telehealth (INDEPENDENT_AMBULATORY_CARE_PROVIDER_SITE_OTHER): Admitting: "Endocrinology

## 2023-06-18 DIAGNOSIS — E042 Nontoxic multinodular goiter: Secondary | ICD-10-CM

## 2023-06-18 DIAGNOSIS — E890 Postprocedural hypothyroidism: Secondary | ICD-10-CM

## 2023-06-18 NOTE — Progress Notes (Signed)
 Outpatient Endocrinology Note [name], MD  06/18/23   Erica Meyers December 06, 1960 [id number]  Referring Provider: Lanae Pinal, MD Primary Care Provider: Lanae Pinal, MD Subjective  No chief complaint on file.   Assessment & Plan  Diagnoses and all orders for this visit:  Multiple thyroid  nodules -     TSH + free T4  Postablative hypothyroidism   DALASIA PREDMORE is currently taking levothyroxine  88 mcg po qd. Patient is biochemically euthyroid.  Educated on thyroid  axis.  Recommend the following: Take levothyroxine  88mcg every morning.  Advised to take levothyroxine  first thing in the morning on empty stomach and wait at least 30 minutes to 1 hour before eating or drinking anything or taking any other medications. Space out levothyroxine  by 4 hours from any acid reflux medication/fibrate/iron/calcium/multivitamin. Take nutritional supplements in the evening. Repeat lab before next visit or sooner if symptoms of hyperthyroidism or hypothyroidism develop.  Notify us  immediately in case of significant weight gain or loss. Counseled on compliance and follow up needs.    Has multiple thyroid  nodules Last thyroid  U/S in 03/2023 reported TR4 Nodule # 4: Slowly enlarging hypoechoic solid nodule in the left mid gland now measures 2.1 x 1.5 x 0.9 cm. FNA benign in 06/14/23. Another nodule (TR4 Nodule # 5: Hypoechoic solid nodule in the left lower gland has slightly enlarged and now measures 1.0 x 0.9 x 0.6 cm, needs 1 year follow up. Nodule # 3 TR2: 3.1 x 2.4 x 2.3 cm and other nodules labelled <1 cm do meet meet criteria for FNA.  Patient needs special sedation and FNA in hospital due to multiple past episodes of pulse drop to 30s?, history of seizures  I have reviewed current medications, nurse's notes, allergies, vital signs, past medical and surgical history, family medical history, and social history for this encounter. Counseled patient on symptoms, examination  findings, lab findings, imaging results, treatment decisions and monitoring and prognosis. The patient understood the recommendations and agrees with the treatment plan. All questions regarding treatment plan were fully answered.   Return in about 6 months (around 12/19/2023).   [name], MD  06/18/23   I have reviewed current medications, nurse's notes, allergies, vital signs, past medical and surgical history, family medical history, and social history for this encounter. Counseled patient on symptoms, examination findings, lab findings, imaging results, treatment decisions and monitoring and prognosis. The patient understood the recommendations and agrees with the treatment plan. All questions regarding treatment plan were fully answered.   History of Present Illness Erica Meyers is a 63 y.o. year old female who presents to our clinic with post-ablative hypothyroidism.  Seen by Dr Washington Hacker in past  Per records, she has "post-RAI hypothyroidism (in 2002, she was dx'ed with hyperthyroidism, due to MNG; she had RAI in June, 2018; she started synthroid  in June, 2019; bx showed SCANT FOLLICULAR EPITHELIUM PRESENT (BETHESDA CATEGORY I); f/u US  in 2019 showed Slight interval growth of nodule within left lobe , which now meets imaging criteria to recommend bx.  Despite reduction in size, nodule #1 within the right lobe of the thyroid  continues to meet imaging criteria to recommend bx--which in 2019 was cat 1; US  in 2022 advised 1 year f/u)."   On levothyroxine  88 mcg po every day  Patient denies any current active complaints Patient had a poor episode with FNA recently in the hospital due to her pulse drop, patient also has history of seizures and needs advanced sedation for FNA  Prior history: Compressive symptoms:  dysphagia  No dysphonia  Yes  positional dyspnea (especially with simultaneous arms elevation)  No  Smokes  No On biotin  No Personal history of head/neck  surgery/irradiation  Yes, RAI ablation and tonsillectomy   Grave's Ophthalmopathy Clinical Activity Score: 0/9  2.1 cm L mid TR-4 nodule. Prev bx attempte 08/2017  06/14/23: THYROID , LEFT MID, FINE NEEDLE ASPIRATION  Benign follicular nodule (Bethesda category II)   Physical Exam  LMP 01/05/2016  Constitutional: well developed, well nourished Head: normocephalic, atraumatic, no exophthalmos Eyes: sclera anicteric, no redness Neck: - thyromegaly, no thyroid  tenderness; + nodules Lungs: normal respiratory effort Neurology: alert and oriented, no fine hand tremor Skin: dry, no appreciable rashes Musculoskeletal: no appreciable defects Psychiatric: normal mood and affect  Allergies Allergies  Allergen Reactions   Penicillins     Childhood, does not recall reaction.   Latex Rash    Current Medications Patient's Medications  New Prescriptions   No medications on file  Previous Medications   ATORVASTATIN (LIPITOR) 20 MG TABLET    Take 20 mg by mouth daily.   CHOLECALCIFEROL (VITAMIN D) 1000 UNITS TABLET    Take 1,000 Units by mouth daily.   DESVENLAFAXINE SUCCINATE ER 25 MG TB24       LAMOTRIGINE  (LAMICTAL ) 150 MG TABLET    TAKE 2 TABLETS BY MOUTH TWICE A DAY   LEVOTHYROXINE  (SYNTHROID ) 88 MCG TABLET    TAKE 1 TABLET BY MOUTH EVERY DAY   LISINOPRIL (ZESTRIL) 10 MG TABLET    Take 10 mg by mouth daily.   LISINOPRIL-HYDROCHLOROTHIAZIDE (PRINZIDE,ZESTORETIC) 20-25 MG TABLET    TAKE 1 TABLET BY MOUTH ONCE A DAY (WILL PAY 3/14)   MELOXICAM (MOBIC) 7.5 MG TABLET    Take 7.5 mg by mouth daily.   MULTIPLE VITAMIN (MULTIVITAMIN WITH MINERALS) TABS TABLET    Take 1 tablet by mouth daily.   OZEMPIC, 0.25 OR 0.5 MG/DOSE, 2 MG/3ML SOPN    Inject 0.25 mg into the skin once a week.   RIZATRIPTAN  (MAXALT -MLT) 10 MG DISINTEGRATING TABLET    Take 1 tablet (10 mg total) by mouth as needed for migraine. May repeat in 2 hours if needed   TOPIRAMATE  (TOPAMAX ) 100 MG TABLET    TAKE 1 TABLET BY MOUTH TWICE  A DAY  Modified Medications   No medications on file  Discontinued Medications   No medications on file    Past Medical History Past Medical History:  Diagnosis Date   Anemia    Depression    Diabetes mellitus without complication (HCC)    Dyslipidemia    Epilepsy (HCC)    Headache    Hypertension    Non-alcoholic fatty liver disease    Thyroid  disease     Past Surgical History Past Surgical History:  Procedure Laterality Date   CESAREAN SECTION     TONSILLECTOMY     TUBAL LIGATION     VEIN LIGATION AND STRIPPING Left     Family History family history includes Hypertension in her mother; Stroke in her mother; Thyroid  disease in her mother.  Social History Social History   Socioeconomic History   Marital status: Divorced    Spouse name: Not on file   Number of children: Not on file   Years of education: Not on file   Highest education level: Not on file  Occupational History   Not on file  Tobacco Use   Smoking status: Never   Smokeless tobacco: Never  Vaping Use  Vaping status: Never Used  Substance and Sexual Activity   Alcohol use: No   Drug use: No   Sexual activity: Not on file  Other Topics Concern   Not on file  Social History Narrative   Not on file   Social Drivers of Health   Financial Resource Strain: Not on file  Food Insecurity: Not on file  Transportation Needs: Not on file  Physical Activity: Not on file  Stress: Not on file  Social Connections: Not on file  Intimate Partner Violence: Not on file    Laboratory Investigations Lab Results  Component Value Date   TSH 2.282 12/06/2022   TSH 5.64 (H) 05/04/2021   TSH 2.95 10/16/2019   FREET4 0.80 12/06/2022   FREET4 0.93 05/04/2021   FREET4 0.77 10/16/2019     No results found for: "TSI"   No components found for: "TRAB"   No results found for: "CHOL" No results found for: "HDL" No results found for: "LDLCALC" No results found for: "TRIG" No results found for:  "CHOLHDL" Lab Results  Component Value Date   CREATININE 1.10 (H) 12/06/2022   No results found for: "GFR"    Component Value Date/Time   NA 141 12/06/2022 1025   NA 141 01/06/2020 1332   K 4.0 12/06/2022 1025   CL 107 12/06/2022 1025   CO2 27 12/06/2022 1025   GLUCOSE 82 12/06/2022 1025   BUN 17 12/06/2022 1025   BUN 11 01/06/2020 1332   CREATININE 1.10 (H) 12/06/2022 1025   CALCIUM 10.3 12/06/2022 1025   PROT 7.3 01/06/2020 1332   ALBUMIN 4.5 01/06/2020 1332   AST 20 01/06/2020 1332   ALT 24 01/06/2020 1332   ALKPHOS 88 01/06/2020 1332   BILITOT 0.3 01/06/2020 1332   GFRNONAA 57 (L) 12/06/2022 1025   GFRAA 64 01/06/2020 1332      Latest Ref Rng & Units 12/06/2022   10:25 AM 01/06/2020    1:32 PM 10/14/2018    2:18 PM  BMP  Glucose 70 - 99 mg/dL 82  161  096   BUN 8 - 23 mg/dL 17  11  21    Creatinine 0.44 - 1.00 mg/dL 0.45  4.09  8.11   BUN/Creat Ratio 9 - 23  10    Sodium 135 - 145 mmol/L 141  141  134   Potassium 3.5 - 5.1 mmol/L 4.0  4.7  3.5   Chloride 98 - 111 mmol/L 107  104  98   CO2 22 - 32 mmol/L 27  23  25    Calcium 8.9 - 10.3 mg/dL 91.4  9.6  9.8        Component Value Date/Time   WBC 5.7 12/06/2022 1025   RBC 5.09 12/06/2022 1025   HGB 15.3 (H) 12/06/2022 1025   HGB 15.2 01/06/2020 1332   HCT 47.7 (H) 12/06/2022 1025   HCT 45.2 01/06/2020 1332   PLT 179 12/06/2022 1025   PLT 248 01/06/2020 1332   MCV 93.7 12/06/2022 1025   MCV 90 01/06/2020 1332   MCH 30.1 12/06/2022 1025   MCHC 32.1 12/06/2022 1025   RDW 14.0 12/06/2022 1025   RDW 12.5 01/06/2020 1332   LYMPHSABS 2.0 12/06/2022 1025   LYMPHSABS 1.7 01/06/2020 1332   MONOABS 0.5 12/06/2022 1025   EOSABS 0.1 12/06/2022 1025   EOSABS 0.1 01/06/2020 1332   BASOSABS 0.1 12/06/2022 1025   BASOSABS 0.1 01/06/2020 1332      Parts of this note may have been dictated  using voice recognition software. There may be variances in spelling and vocabulary which are unintentional. Not all errors are  proofread. Please notify the [facility] if any discrepancies are noted or if the meaning of any statement is not clear.

## 2023-08-21 IMAGING — US US THYROID
1 series · 12 of 25 positions shown · non-contrast
Comparison: September 2019

CLINICAL DATA: Prior ultrasound follow-up.

EXAM:
THYROID ULTRASOUND
TECHNIQUE: Ultrasound examination of the thyroid gland and adjacent soft
tissues was performed.

[Series 1: us thyroid · 0.04mm/px · 12 of 46 slices shown]
[im 2/46]
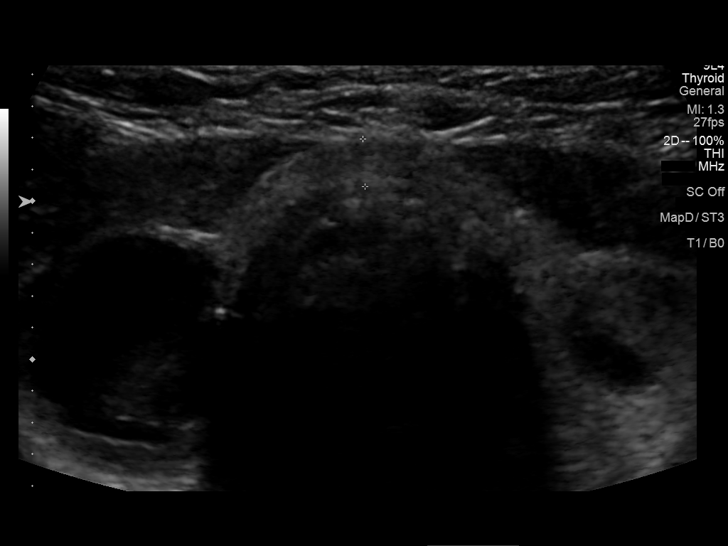
[im 6/46]
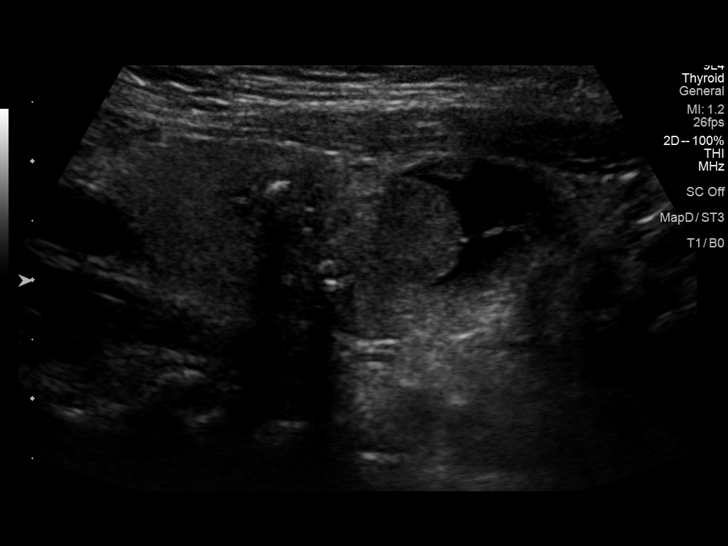
[im 10/46]
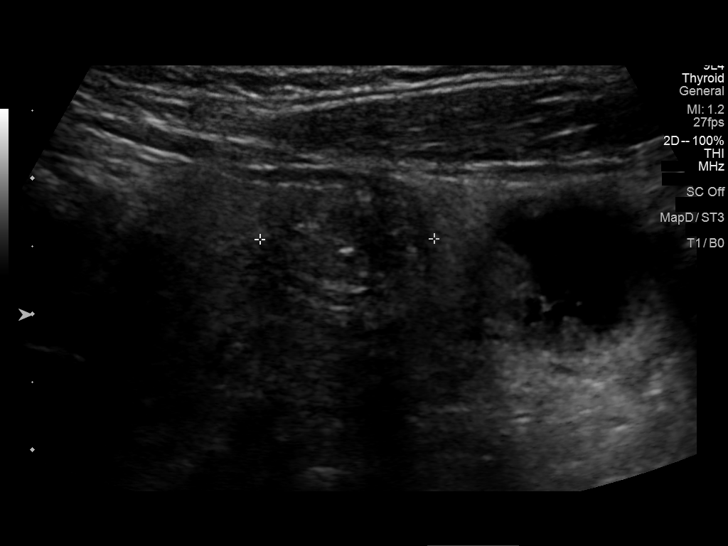
[im 14/46]
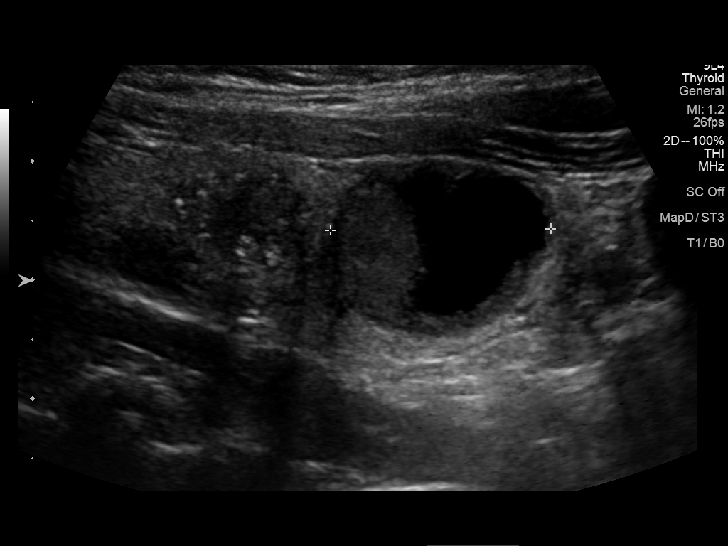
[im 17/46]
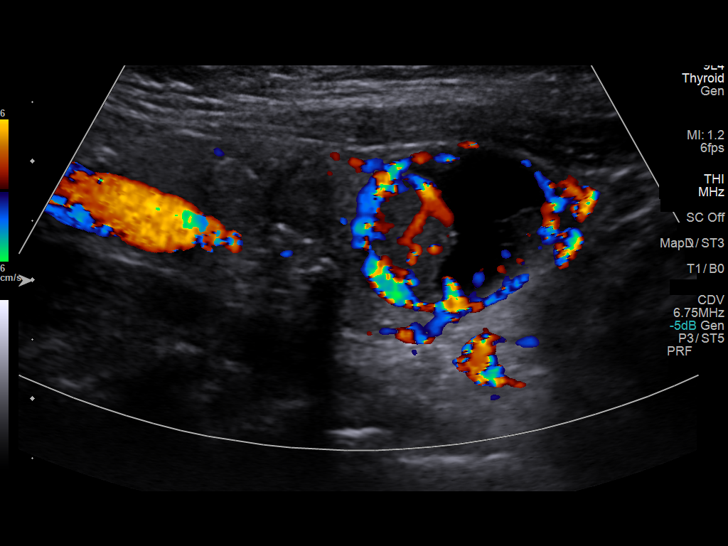
[im 21/46]
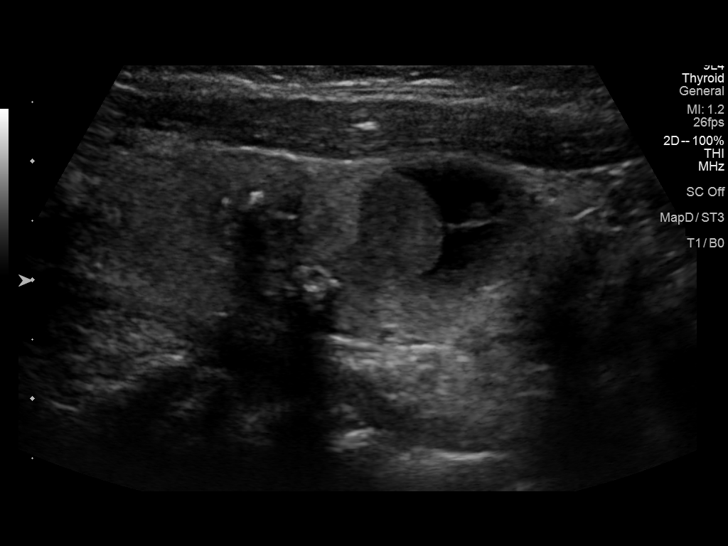
[im 25/46]
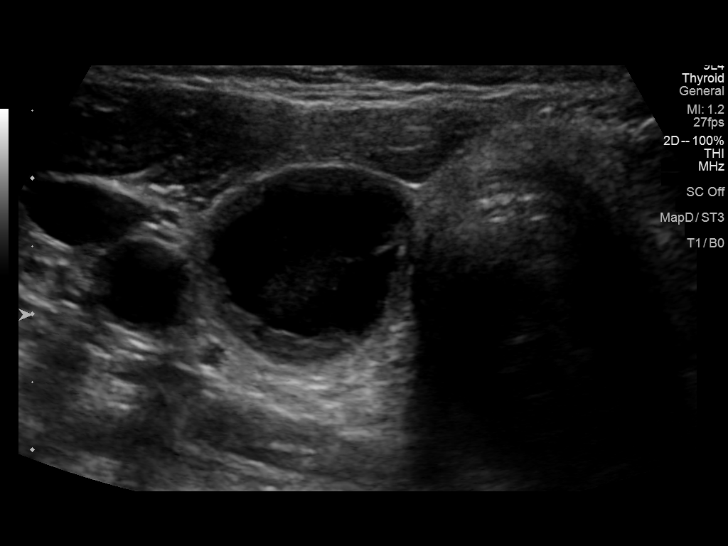
[im 29/46]
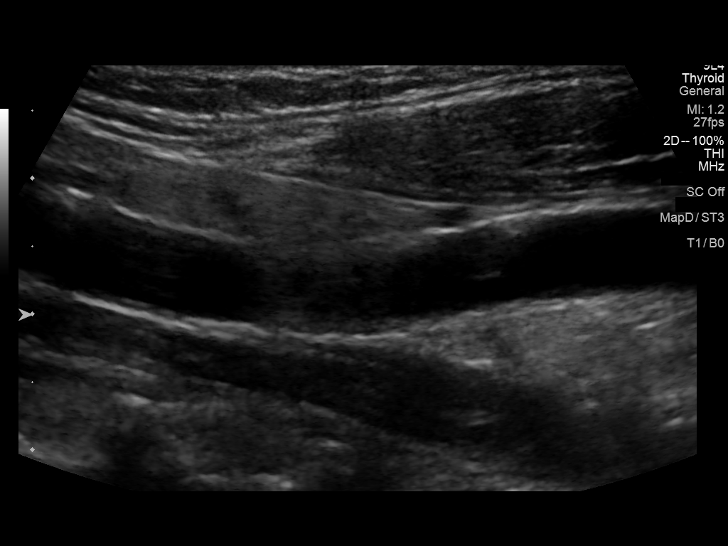
[im 32/46]
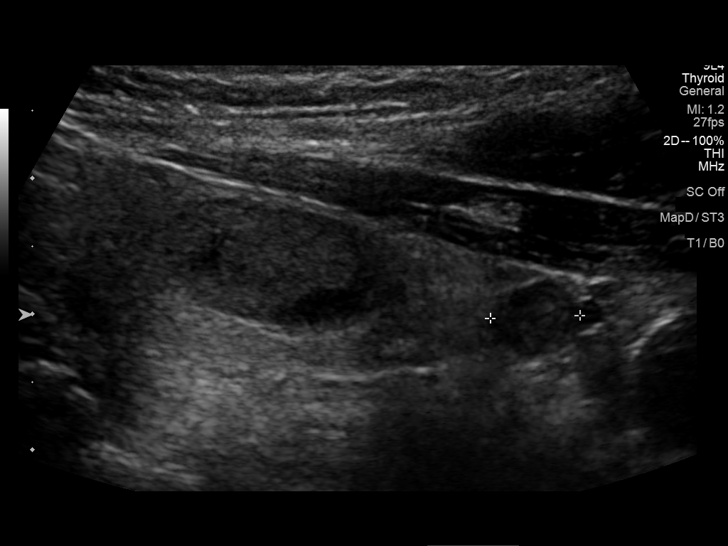
[im 36/46]
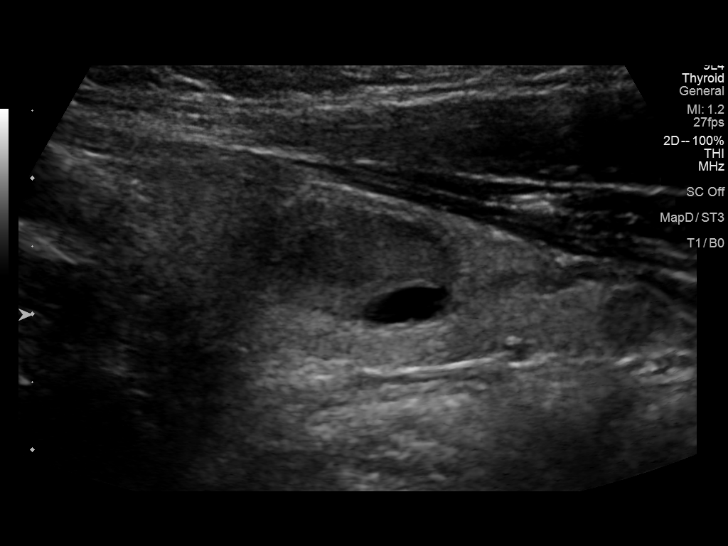
[im 40/46]
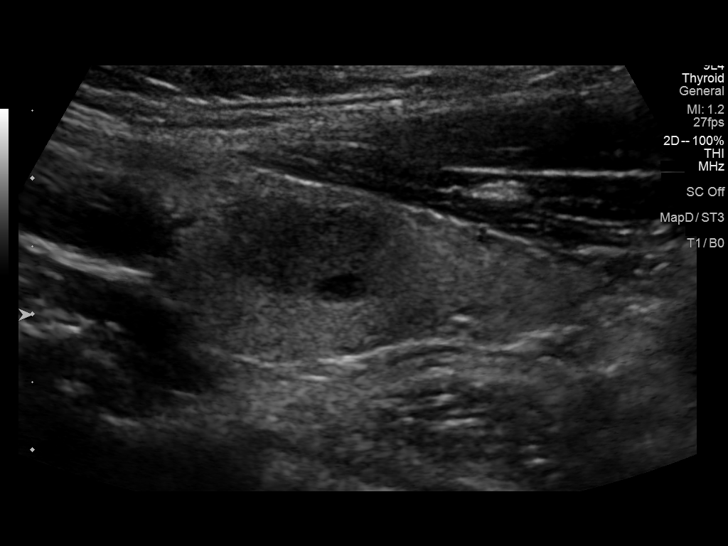
[im 44/46]
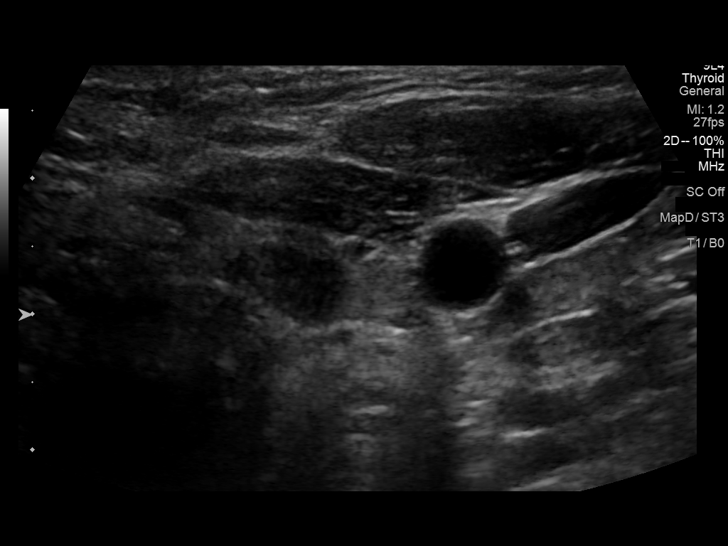

[12 of 25 positions shown; findings below may reference images not displayed]

FINDINGS: Parenchymal Echotexture: Moderately heterogenous

Isthmus: 0.3 cm

Right lobe: 4.4 x 1.6 x 1.6 cm

Left lobe: 4.4 x 1.4 x 1.4 cm

_________________________________________________________

Estimated total number of nodules >/= 1 cm: 3

Number of spongiform nodules >/=  2 cm not described below (TR1): 0

Number of mixed cystic and solid nodules >/= 1.5 cm not described
below (TR2): 0

_________________________________________________________

Nodule # 1:

Location: Right; Superior

Maximum size: 1.3 cm; Other 2 dimensions: 0.9 x 0.8 cm; previously
measuring 1.3 x 0.7 x 0.7 cm

Composition: solid/almost completely solid (2)

Echogenicity: hypoechoic (2)

Shape: not taller-than-wide (0)

Margins: smooth (0)

Echogenic foci: punctate echogenic foci (3)

ACR TI-RADS total points: 7.

ACR TI-RADS risk category: TR5 (>/= 7 points).

ACR TI-RADS recommendations:

**Given size (>/= 1.0 cm) and appearance, fine needle aspiration of
this highly suspicious nodule should be considered based on TI-RADS
criteria.

_________________________________________________________

Nodule # 2:

Location: Right; Mid

Maximum size: 0.5 cm; Other 2 dimensions: 0.5 x 0.4 cm; previously
measuring 0.6 x 0.5 x 0.4 cm

Composition: cannot determine (2)

Echogenicity: cannot determine (1)

Shape: not taller-than-wide (0)

Margins: cannot determine (0)

Echogenic foci: peripheral calcifications (2)

ACR TI-RADS total points: 5.

ACR TI-RADS risk category: TR4 (4-6 points).

ACR TI-RADS recommendations:

Given size (<0.9 cm) and appearance, this nodule does NOT meet
TI-RADS criteria for biopsy or dedicated follow-up.

_________________________________________________________

Nodule labeled 3 refers to a cystic/almost completely cystic nodule
in the inferior right thyroid lobe measuring up to 1.9 cm,
previously 1.7 cm. There remains essentially unchanged, and does not
meet criteria for dedicated continue to meet follow-up or biopsy.

Nodule labeled 4 refers to a solid hypoechoic (TR 4) nodule in the
mid aspect of the left thyroid lobe measuring up to 1.6 x 1.1 x
cm, previously 1.5 x 1.1 x 0.8 cm. It was attempted to be biopsied
in 2958. **Given size (>/= 1.5 cm) and appearance, fine needle
aspiration of this moderately suspicious nodule should be considered
based on TI-RADS criteria. It remains essentially unchanged.

Nodule labeled 5 refers to a subcentimeter solid hypoechoic (TR 4)
nodule in the inferior aspect of the left thyroid lobe, which again
does not meet criteria for dedicated follow-up or biopsy. Given size
(<0.9 cm) and appearance, this nodule does NOT meet TI-RADS criteria
for biopsy or dedicated follow-up.
IMPRESSION: 1. Multinodular thyroid gland.
2. Nodules labeled 1 and 4 continue to meet criteria for biopsy.
Note was made of attempted biopsy of nodule labeled 4 in 2958.
Consider biopsy versus annual ultrasound follow-up as patient is
best able to tolerate. These nodules have demonstrated stability
since at least 2958. A minimal total follow-up interval of 5 years
is recommended to determine benign nature if no biopsy performed.

The above is in keeping with the ACR TI-RADS recommendations - [HOSPITAL] 5301;[DATE].

## 2023-09-06 DIAGNOSIS — M25561 Pain in right knee: Secondary | ICD-10-CM | POA: Diagnosis not present

## 2023-09-06 DIAGNOSIS — E118 Type 2 diabetes mellitus with unspecified complications: Secondary | ICD-10-CM | POA: Diagnosis not present

## 2023-09-06 DIAGNOSIS — M1711 Unilateral primary osteoarthritis, right knee: Secondary | ICD-10-CM | POA: Diagnosis not present

## 2023-09-06 DIAGNOSIS — I10 Essential (primary) hypertension: Secondary | ICD-10-CM | POA: Diagnosis not present

## 2023-09-09 ENCOUNTER — Other Ambulatory Visit: Payer: Self-pay | Admitting: Neurology

## 2023-09-09 ENCOUNTER — Other Ambulatory Visit: Payer: Self-pay | Admitting: Internal Medicine

## 2023-09-12 ENCOUNTER — Telehealth: Payer: Self-pay | Admitting: Neurology

## 2023-09-12 NOTE — Telephone Encounter (Signed)
 Requested Prescriptions   Pending Prescriptions Disp Refills   levothyroxine  (SYNTHROID ) 88 MCG tablet [Pharmacy Med Name: LEVOTHYROXINE  88 MCG TABLET] 30 tablet 0    Sig: TAKE 1 TABLET BY MOUTH EVERY DAY

## 2023-09-12 NOTE — Telephone Encounter (Signed)
 Patient schedule appointment to be able to get medication refill for topiramate  (TOPAMAX ) 100 MG tablet

## 2023-09-13 ENCOUNTER — Ambulatory Visit (INDEPENDENT_AMBULATORY_CARE_PROVIDER_SITE_OTHER): Admitting: "Endocrinology

## 2023-09-13 ENCOUNTER — Encounter: Payer: Self-pay | Admitting: "Endocrinology

## 2023-09-13 VITALS — BP 140/80 | HR 84 | Ht 63.0 in | Wt 176.0 lb

## 2023-09-13 DIAGNOSIS — E042 Nontoxic multinodular goiter: Secondary | ICD-10-CM | POA: Diagnosis not present

## 2023-09-13 DIAGNOSIS — E890 Postprocedural hypothyroidism: Secondary | ICD-10-CM

## 2023-09-13 NOTE — Progress Notes (Signed)
 Outpatient Endocrinology Note Erica Birmingham, MD  09/13/23   Erica Meyers 1960-07-05 [id number]  Referring Provider: Regino Slater, MD Primary Care Provider: Regino Slater, MD Subjective  No chief complaint on file.   Assessment & Plan  Diagnoses and all orders for this visit:  Postablative hypothyroidism  Multiple thyroid  nodules    Erica Meyers is currently taking levothyroxine  88 mcg po qd. Patient is biochemically euthyroid.  Educated on thyroid  axis.  Recommend the following: Take levothyroxine  88mcg every morning.  Advised to take levothyroxine  first thing in the morning on empty stomach and wait at least 30 minutes to 1 hour before eating or drinking anything or taking any other medications. Space out levothyroxine  by 4 hours from any acid reflux medication/fibrate/iron/calcium/multivitamin. Take nutritional supplements in the evening. Repeat lab before next visit or sooner if symptoms of hyperthyroidism or hypothyroidism develop.  Notify us  immediately in case of significant weight gain or loss. Counseled on compliance and follow up needs.    Has multiple thyroid  nodules Last thyroid  U/S in 03/2023 reported TR4 Nodule # 4: Slowly enlarging hypoechoic solid nodule in the left mid gland now measures 2.1 x 1.5 x 0.9 cm. FNA benign in 06/14/23. Another nodule (TR4 Nodule # 5: Hypoechoic solid nodule in the left lower gland has slightly enlarged and now measures 1.0 x 0.9 x 0.6 cm, needs 1 year follow up. Nodule # 3 TR2: 3.1 x 2.4 x 2.3 cm and other nodules labelled <1 cm do meet meet criteria for FNA.  Patient needed special sedation and FNA in hospital due to multiple past episodes of pulse drop to 30s?, history of seizures F/U thyroid  ultrasound 06/2024  I have reviewed current medications, nurse's notes, allergies, vital signs, past medical and surgical history, family medical history, and social history for this encounter. Counseled patient on symptoms,  examination findings, lab findings, imaging results, treatment decisions and monitoring and prognosis. The patient understood the recommendations and agrees with the treatment plan. All questions regarding treatment plan were fully answered.   Return in about 6 months (around 03/15/2024) for visit, labs today.   Erica Birmingham, MD  09/13/23   I have reviewed current medications, nurse's notes, allergies, vital signs, past medical and surgical history, family medical history, and social history for this encounter. Counseled patient on symptoms, examination findings, lab findings, imaging results, treatment decisions and monitoring and prognosis. The patient understood the recommendations and agrees with the treatment plan. All questions regarding treatment plan were fully answered.   History of Present Illness Erica Meyers is a 63 y.o. year old female who presents to our clinic with post-ablative hypothyroidism.  Symptoms suggestive of HYPOTHYROIDISM:  fatigue Yes weight gain Yes cold intolerance varies between hot/cold  constipation  No  Symptoms suggestive of HYPERTHYROIDISM:  weight loss  No heat intolerance varies between hot/cold  hyperdefecation  No palpitations  No  Compressive symptoms:  dysphagia  No dysphonia  Yes  positional dyspnea (especially with simultaneous arms elevation)  No  Smokes  No On biotin  No Personal history of head/neck surgery/irradiation  Yes, RAI ablation and tonsillectomy   Grave's Ophthalmopathy Clinical Activity Score: 0/9  Seen by Dr Kassie in past  Per records, she has post-RAI hypothyroidism (in 2002, she was dx'ed with hyperthyroidism, due to MNG; she had RAI in June, 2018; she started synthroid  in June, 2019; bx showed SCANT FOLLICULAR EPITHELIUM PRESENT (BETHESDA CATEGORY I); f/u US  in 2019 showed Slight interval growth of nodule within  left lobe , which now meets imaging criteria to recommend bx.  Despite reduction in size, nodule  #1 within the right lobe of the thyroid  continues to meet imaging criteria to recommend bx--which in 2019 was cat 1; US  in 2022 advised 1 year f/u).   On levothyroxine  88 mcg po every day  Patient denies any current active complaints Patient had a poor episode with FNA recently in the hospital due to her pulse drop, patient also has history of seizures and needs advanced sedation for FNA  Prior history: 2.1 cm L mid TR-4 nodule. Prev bx attempte 08/2017  06/14/23: THYROID , LEFT MID, FINE NEEDLE ASPIRATION  Benign follicular nodule (Bethesda category II)   Physical Exam  BP (!) 140/80   Pulse 84   Ht 5' 3 (1.6 m)   Wt 176 lb (79.8 kg)   LMP 01/05/2016   SpO2 96%   BMI 31.18 kg/m  Constitutional: well developed, well nourished Head: normocephalic, atraumatic, no exophthalmos Eyes: sclera anicteric, no redness Neck: - thyromegaly, no thyroid  tenderness; + nodule Lungs: normal respiratory effort Neurology: alert and oriented, no fine hand tremor Skin: dry, no appreciable rashes Musculoskeletal: no appreciable defects Psychiatric: normal mood and affect  Allergies Allergies  Allergen Reactions   Penicillins     Childhood, does not recall reaction.   Latex Rash    Current Medications Patient's Medications  New Prescriptions   No medications on file  Previous Medications   ATORVASTATIN (LIPITOR) 20 MG TABLET    Take 20 mg by mouth daily.   CHOLECALCIFEROL (VITAMIN D) 1000 UNITS TABLET    Take 1,000 Units by mouth daily.   DESVENLAFAXINE SUCCINATE ER 25 MG TB24       LAMOTRIGINE  (LAMICTAL ) 150 MG TABLET    TAKE 2 TABLETS BY MOUTH TWICE A DAY   LEVOTHYROXINE  (SYNTHROID ) 88 MCG TABLET    TAKE 1 TABLET BY MOUTH EVERY DAY   LISINOPRIL (ZESTRIL) 10 MG TABLET    Take 10 mg by mouth daily.   LISINOPRIL-HYDROCHLOROTHIAZIDE (PRINZIDE,ZESTORETIC) 20-25 MG TABLET    TAKE 1 TABLET BY MOUTH ONCE A DAY (WILL PAY 3/14)   MELOXICAM (MOBIC) 7.5 MG TABLET    Take 7.5 mg by mouth daily.    MULTIPLE VITAMIN (MULTIVITAMIN WITH MINERALS) TABS TABLET    Take 1 tablet by mouth daily.   OLMESARTAN (BENICAR) 20 MG TABLET    1 tablet Orally Once a day; Duration: 30 days   OZEMPIC, 0.25 OR 0.5 MG/DOSE, 2 MG/3ML SOPN    Inject 0.25 mg into the skin once a week.   RIZATRIPTAN  (MAXALT -MLT) 10 MG DISINTEGRATING TABLET    Take 1 tablet (10 mg total) by mouth as needed for migraine. May repeat in 2 hours if needed   TOPIRAMATE  (TOPAMAX ) 100 MG TABLET    TAKE 1 TABLET BY MOUTH TWICE A DAY  Modified Medications   No medications on file  Discontinued Medications   No medications on file    Past Medical History Past Medical History:  Diagnosis Date   Anemia    Depression    Diabetes mellitus without complication (HCC)    Dyslipidemia    Epilepsy (HCC)    Headache    Hypertension    Non-alcoholic fatty liver disease    Thyroid  disease     Past Surgical History Past Surgical History:  Procedure Laterality Date   CESAREAN SECTION     TONSILLECTOMY     TUBAL LIGATION     VEIN LIGATION AND STRIPPING  Left     Family History family history includes Hypertension in her mother; Stroke in her mother; Thyroid  disease in her mother.  Social History Social History   Socioeconomic History   Marital status: Divorced    Spouse name: Not on file   Number of children: Not on file   Years of education: Not on file   Highest education level: Not on file  Occupational History   Not on file  Tobacco Use   Smoking status: Never   Smokeless tobacco: Never  Vaping Use   Vaping status: Never Used  Substance and Sexual Activity   Alcohol use: No   Drug use: No   Sexual activity: Not on file  Other Topics Concern   Not on file  Social History Narrative   Not on file   Social Drivers of Health   Financial Resource Strain: Not on file  Food Insecurity: Not on file  Transportation Needs: Not on file  Physical Activity: Not on file  Stress: Not on file  Social Connections: Not on  file  Intimate Partner Violence: Not on file    Laboratory Investigations Lab Results  Component Value Date   TSH 2.282 12/06/2022   TSH 5.64 (H) 05/04/2021   TSH 2.95 10/16/2019   FREET4 0.80 12/06/2022   FREET4 0.93 05/04/2021   FREET4 0.77 10/16/2019     No results found for: TSI   No components found for: TRAB   No results found for: CHOL No results found for: HDL No results found for: LDLCALC No results found for: TRIG No results found for: [facility] Lab Results  Component Value Date   CREATININE 1.10 (H) 12/06/2022   No results found for: GFR    Component Value Date/Time   NA 141 12/06/2022 1025   NA 141 01/06/2020 1332   K 4.0 12/06/2022 1025   CL 107 12/06/2022 1025   CO2 27 12/06/2022 1025   GLUCOSE 82 12/06/2022 1025   BUN 17 12/06/2022 1025   BUN 11 01/06/2020 1332   CREATININE 1.10 (H) 12/06/2022 1025   CALCIUM 10.3 12/06/2022 1025   PROT 7.3 01/06/2020 1332   ALBUMIN 4.5 01/06/2020 1332   AST 20 01/06/2020 1332   ALT 24 01/06/2020 1332   ALKPHOS 88 01/06/2020 1332   BILITOT 0.3 01/06/2020 1332   GFRNONAA 57 (L) 12/06/2022 1025   GFRAA 64 01/06/2020 1332      Latest Ref Rng & Units 12/06/2022   10:25 AM 01/06/2020    1:32 PM 10/14/2018    2:18 PM  BMP  Glucose 70 - 99 mg/dL 82  878  895   BUN 8 - 23 mg/dL 17  11  21    Creatinine 0.44 - 1.00 mg/dL 8.89  8.90  8.98   BUN/Creat Ratio 9 - 23  10    Sodium 135 - 145 mmol/L 141  141  134   Potassium 3.5 - 5.1 mmol/L 4.0  4.7  3.5   Chloride 98 - 111 mmol/L 107  104  98   CO2 22 - 32 mmol/L 27  23  25    Calcium 8.9 - 10.3 mg/dL 89.6  9.6  9.8        Component Value Date/Time   WBC 5.7 12/06/2022 1025   RBC 5.09 12/06/2022 1025   HGB 15.3 (H) 12/06/2022 1025   HGB 15.2 01/06/2020 1332   HCT 47.7 (H) 12/06/2022 1025   HCT 45.2 01/06/2020 1332   PLT 179 12/06/2022 1025  PLT 248 01/06/2020 1332   MCV 93.7 12/06/2022 1025   MCV 90 01/06/2020 1332   MCH 30.1 12/06/2022 1025   MCHC  32.1 12/06/2022 1025   RDW 14.0 12/06/2022 1025   RDW 12.5 01/06/2020 1332   LYMPHSABS 2.0 12/06/2022 1025   LYMPHSABS 1.7 01/06/2020 1332   MONOABS 0.5 12/06/2022 1025   EOSABS 0.1 12/06/2022 1025   EOSABS 0.1 01/06/2020 1332   BASOSABS 0.1 12/06/2022 1025   BASOSABS 0.1 01/06/2020 1332      Parts of this note may have been dictated using voice recognition software. There may be variances in spelling and vocabulary which are unintentional. Not all errors are proofread. Please notify the dino if any discrepancies are noted or if the meaning of any statement is not clear.

## 2023-09-14 LAB — TSH+FREE T4: TSH W/REFLEX TO FT4: 4.45 m[IU]/L (ref 0.40–4.50)

## 2023-09-16 MED ORDER — TOPIRAMATE 100 MG PO TABS: 100.0000 mg | ORAL_TABLET | Freq: Two times a day (BID) | ORAL | 0 refills | Status: AC

## 2023-09-16 NOTE — Telephone Encounter (Signed)
 Pt is needing medication sent in to the [facility] on [address]

## 2023-09-16 NOTE — Telephone Encounter (Signed)
 refilled

## 2023-09-16 NOTE — Addendum Note (Signed)
 Addended by: ONEITA NEVELYN BRAVO on: 09/16/2023 09:07 AM   Modules accepted: Orders

## 2023-09-19 DIAGNOSIS — I10 Essential (primary) hypertension: Secondary | ICD-10-CM | POA: Diagnosis not present

## 2023-09-19 DIAGNOSIS — N1831 Chronic kidney disease, stage 3a: Secondary | ICD-10-CM | POA: Diagnosis not present

## 2023-09-19 DIAGNOSIS — E1169 Type 2 diabetes mellitus with other specified complication: Secondary | ICD-10-CM | POA: Diagnosis not present

## 2023-09-23 DIAGNOSIS — M25561 Pain in right knee: Secondary | ICD-10-CM | POA: Diagnosis not present

## 2023-10-03 DIAGNOSIS — R569 Unspecified convulsions: Secondary | ICD-10-CM | POA: Diagnosis not present

## 2023-10-03 DIAGNOSIS — E1122 Type 2 diabetes mellitus with diabetic chronic kidney disease: Secondary | ICD-10-CM | POA: Diagnosis not present

## 2023-10-03 DIAGNOSIS — N1831 Chronic kidney disease, stage 3a: Secondary | ICD-10-CM | POA: Diagnosis not present

## 2023-10-03 DIAGNOSIS — E7800 Pure hypercholesterolemia, unspecified: Secondary | ICD-10-CM | POA: Diagnosis not present

## 2023-10-03 DIAGNOSIS — E041 Nontoxic single thyroid nodule: Secondary | ICD-10-CM | POA: Diagnosis not present

## 2023-10-05 ENCOUNTER — Other Ambulatory Visit: Payer: Self-pay | Admitting: "Endocrinology

## 2023-10-14 ENCOUNTER — Other Ambulatory Visit

## 2023-10-18 ENCOUNTER — Ambulatory Visit: Admitting: "Endocrinology

## 2023-10-25 ENCOUNTER — Other Ambulatory Visit: Payer: Self-pay | Admitting: Neurology

## 2023-11-04 DIAGNOSIS — M25552 Pain in left hip: Secondary | ICD-10-CM | POA: Diagnosis not present

## 2023-11-04 DIAGNOSIS — M25551 Pain in right hip: Secondary | ICD-10-CM | POA: Diagnosis not present

## 2023-11-04 DIAGNOSIS — M25561 Pain in right knee: Secondary | ICD-10-CM | POA: Diagnosis not present

## 2023-11-12 ENCOUNTER — Telehealth: Payer: Self-pay | Admitting: Pharmacist

## 2023-11-12 NOTE — Progress Notes (Signed)
   11/12/2023  Patient ID: Erica Meyers, female   DOB: 1960-08-09, 63 y.o.   MRN: [id number]  Patient stopped Ozempic about 1-2 months ago in August due to benign thyroid  nodule identified.   Called and spoke with the patient on the phone today. Report she has been checking her BG sporadically and seems like the readings are up and down. She is trying hard to focus on diet and feels like PT is helping to keep her more active. Made aware of appointment in December for next A1c check. She will call us  if there are concerns regarding BG's prior to then.  Of note, Lisinopril showed on the fill history. Confirms she is only taking the Olmesartan at this time. Requested the pharmacy to place the medication back but does not appear they've done that. Regarding Lamotrigine  and Topiramate , she is waiting on follow-up appointment to get medication refills going forward.   No further concerns for PCP at this time.   Aloysius Lewis, PharmD [facility]  Phone Number: [phone]

## 2023-11-13 DIAGNOSIS — M25561 Pain in right knee: Secondary | ICD-10-CM | POA: Diagnosis not present

## 2023-11-13 DIAGNOSIS — M25551 Pain in right hip: Secondary | ICD-10-CM | POA: Diagnosis not present

## 2023-11-13 DIAGNOSIS — M25552 Pain in left hip: Secondary | ICD-10-CM | POA: Diagnosis not present

## 2023-11-25 DIAGNOSIS — M25561 Pain in right knee: Secondary | ICD-10-CM | POA: Diagnosis not present

## 2023-11-25 DIAGNOSIS — M25552 Pain in left hip: Secondary | ICD-10-CM | POA: Diagnosis not present

## 2023-11-25 DIAGNOSIS — M25551 Pain in right hip: Secondary | ICD-10-CM | POA: Diagnosis not present

## 2023-11-28 DIAGNOSIS — M25551 Pain in right hip: Secondary | ICD-10-CM | POA: Diagnosis not present

## 2023-11-28 DIAGNOSIS — M25561 Pain in right knee: Secondary | ICD-10-CM | POA: Diagnosis not present

## 2023-11-28 DIAGNOSIS — M25552 Pain in left hip: Secondary | ICD-10-CM | POA: Diagnosis not present

## 2024-01-14 ENCOUNTER — Other Ambulatory Visit (HOSPITAL_COMMUNITY): Payer: Self-pay | Admitting: Sports Medicine

## 2024-01-14 DIAGNOSIS — M25552 Pain in left hip: Secondary | ICD-10-CM

## 2024-01-27 ENCOUNTER — Ambulatory Visit (HOSPITAL_COMMUNITY)
Admission: RE | Admit: 2024-01-27 | Discharge: 2024-01-27 | Disposition: A | Discharge status: Home / Self Care | Source: Ambulatory Visit | Attending: Sports Medicine | Admitting: Sports Medicine

## 2024-01-27 DIAGNOSIS — M25552 Pain in left hip: Secondary | ICD-10-CM | POA: Insufficient documentation

## 2024-02-10 ENCOUNTER — Ambulatory Visit: Admitting: Neurology

## 2024-03-16 ENCOUNTER — Ambulatory Visit: Admitting: "Endocrinology

## 2024-05-07 ENCOUNTER — Ambulatory Visit: Admitting: Neurology
